# Patient Record
Sex: Male | Born: 1935 | Race: White | Hispanic: No
Health system: Midwestern US, Community
[De-identification: ages and names within clinical notes are randomized; demographics above are authoritative.]

## PROBLEM LIST (undated history)

## (undated) DIAGNOSIS — I4892 Unspecified atrial flutter: Secondary | ICD-10-CM

## (undated) DIAGNOSIS — I480 Paroxysmal atrial fibrillation: Secondary | ICD-10-CM

## (undated) DIAGNOSIS — I10 Essential (primary) hypertension: Secondary | ICD-10-CM

## (undated) HISTORY — DX: Unspecified atrial flutter: I48.92

## (undated) HISTORY — DX: Paroxysmal atrial fibrillation: I48.0

## (undated) HISTORY — PX: TONSILLECTOMY: SUR1361

## (undated) HISTORY — PX: TOTAL ANKLE REPLACEMENT: SUR1218

## (undated) HISTORY — DX: Essential (primary) hypertension: I10

---

## 2002-07-01 ENCOUNTER — Inpatient Hospital Stay (HOSPITAL_COMMUNITY): Admission: RE | Admit: 2002-07-01 | Discharge: 2002-07-03 | Payer: Self-pay | Admitting: Urology

## 2002-07-01 HISTORY — PX: TRANSURETHRAL RESECTION OF PROSTATE: SHX73

## 2011-03-12 ENCOUNTER — Telehealth: Payer: Self-pay | Admitting: Cardiology

## 2011-03-12 NOTE — Telephone Encounter (Signed)
Requesting app w/atypical cp; sent records; 1st available is 8/6. She accepted.

## 2011-03-12 NOTE — Telephone Encounter (Signed)
[facility] called wanted Nicholas Wood to see this pt for atypical chest pain but Dr [place] doesn't have consult appt until September please advise her if sooner appt can be made

## 2011-03-21 ENCOUNTER — Encounter: Payer: Self-pay | Admitting: *Deleted

## 2011-03-25 ENCOUNTER — Encounter: Payer: Self-pay | Admitting: Cardiology

## 2011-03-25 ENCOUNTER — Ambulatory Visit (INDEPENDENT_AMBULATORY_CARE_PROVIDER_SITE_OTHER): Payer: Medicare Other | Admitting: Cardiology

## 2011-03-25 DIAGNOSIS — R079 Chest pain, unspecified: Secondary | ICD-10-CM | POA: Insufficient documentation

## 2011-03-25 DIAGNOSIS — I10 Essential (primary) hypertension: Secondary | ICD-10-CM

## 2011-03-25 NOTE — Assessment & Plan Note (Signed)
His blood pressure is elevated today. He is currently on Benicar and HCTZ. Prior to that he was on amlodipine 10 mg daily. I have suggested that he resume amlodipine 2.5 mg daily in addition to his current medications. He will monitor his blood pressure more closely at home.

## 2011-03-25 NOTE — Patient Instructions (Signed)
I would consider taking 2.5 mg Norvasc in addition to your other medications for blood pressure.  I will be happy to see you again if you have recurrent chest pain.

## 2011-03-25 NOTE — Assessment & Plan Note (Signed)
His prior chest pain is atypical and most consistent with a musculoskeletal injury. It has resolved. His only risk factor is hypertension. I've recommended continue risk factor modification. I don't feel that he needs stress testing at this point unless he were to have recurrent pain.

## 2011-03-25 NOTE — Progress Notes (Signed)
   Nicholas Wood, Dr. 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 Renne Crigler for evaluation of chest pain. He is a pleasant 75 year old white male. One month ago he was cycling with his brother riding mountain bikes when he felt a pull in his rib cage. After this he developed significant pain in his ribs to the point where he had difficulty sleeping. His blood pressure was elevated that time. He reports that it felt like a muscle strain and in fact his pain did resolve. The pain was exacerbated by sneezing. Since then he has had no recurrent chest pain. His only cardiac risk factor is a history of hypertension. He has had no prior cardiac evaluation.  Current Outpatient Prescriptions on File Prior to Visit  Medication Sig Dispense Refill  . Cyanocobalamin (VITAMIN B-12 IJ) Inject 1,000 mLs as directed every 30 (thirty) days.        Marland Kitchen FOLIC ACID PO Take 1 tablet by mouth daily.        . hydrochlorothiazide 25 MG tablet Take 12.5 mg by mouth daily.       Marland Kitchen olmesartan (BENICAR) 40 MG tablet Take 40 mg by mouth daily.        . Omega-3 Fatty Acids (FISH OIL PO) Take 1 capsule by mouth daily.       . Tadalafil (CIALIS PO) Take 1 tablet by mouth as needed.        . Zolpidem Tartrate (AMBIEN PO) Take 1 tablet by mouth as needed.          No Known Allergies  Past Medical History  Diagnosis Date  . Chest pain   . Hypertension     Past Surgical History  Procedure Date  . Transurethral resection of prostate 07/01/2002    Benign prostatic hypertrophy with bladder outlet obstruction    History  Smoking status  . Never Smoker   Smokeless tobacco  . Never Used    History  Alcohol Use No    Family History  Problem Relation Age of Onset  . Heart failure Father   . Alzheimer's disease Mother   . Diabetes Daughter     Obesity    Review of Systems: The review of systems is positive for hypertension. He feels that he is in excellent health.  He eats a DASH diet. All other systems were reviewed and are negative.  Physical Exam: BP 150/92  Pulse 60  Ht 5\' 10"  (1.778 m)  Wt 184 lb (83.462 kg)  BMI 26.40 kg/m2 He is a pleasant white male in no acute distress. He is normocephalic, atraumatic. Pupils are equal round and reactive to light accommodation. Extraocular movements are full. Sclera clear. Oropharynx is clear. Neck is supple no JVD, adenopathy, thyromegaly, or bruits. Lungs are clear to station and percussion. Cardiac exam reveals a regular rate and rhythm without gallop, murmur, or click. Abdomen is soft and nontender without masses or bruits. Femoral and pedal pulses are 2+ and symmetric. He has no edema. He is alert and oriented x3. Cranial nerves II through XII are intact. LABORATORY DATA: Blood work dated February 27, 2011 showed a normal CBC and chemistry panel. CPK was 79. His last lipid panel showed a non-HDL of 117 with an LDL of 161, HDL 43, and triglycerides of 36. ECG dated February 25, 2011 was normal.  Assessment / Plan:

## 2011-09-30 DIAGNOSIS — I10 Essential (primary) hypertension: Secondary | ICD-10-CM | POA: Diagnosis not present

## 2011-10-28 ENCOUNTER — Other Ambulatory Visit: Payer: Self-pay | Admitting: Dermatology

## 2011-10-28 DIAGNOSIS — L578 Other skin changes due to chronic exposure to nonionizing radiation: Secondary | ICD-10-CM | POA: Diagnosis not present

## 2011-10-28 DIAGNOSIS — C44211 Basal cell carcinoma of skin of unspecified ear and external auricular canal: Secondary | ICD-10-CM | POA: Diagnosis not present

## 2011-12-04 DIAGNOSIS — I10 Essential (primary) hypertension: Secondary | ICD-10-CM | POA: Diagnosis not present

## 2012-01-03 DIAGNOSIS — M79609 Pain in unspecified limb: Secondary | ICD-10-CM | POA: Diagnosis not present

## 2012-01-03 DIAGNOSIS — I10 Essential (primary) hypertension: Secondary | ICD-10-CM | POA: Diagnosis not present

## 2012-01-24 DIAGNOSIS — M19079 Primary osteoarthritis, unspecified ankle and foot: Secondary | ICD-10-CM | POA: Diagnosis not present

## 2012-04-13 DIAGNOSIS — N402 Nodular prostate without lower urinary tract symptoms: Secondary | ICD-10-CM | POA: Diagnosis not present

## 2012-04-13 DIAGNOSIS — N401 Enlarged prostate with lower urinary tract symptoms: Secondary | ICD-10-CM | POA: Diagnosis not present

## 2012-04-13 DIAGNOSIS — N138 Other obstructive and reflux uropathy: Secondary | ICD-10-CM | POA: Diagnosis not present

## 2012-04-13 DIAGNOSIS — N529 Male erectile dysfunction, unspecified: Secondary | ICD-10-CM | POA: Diagnosis not present

## 2012-06-04 DIAGNOSIS — Z23 Encounter for immunization: Secondary | ICD-10-CM | POA: Diagnosis not present

## 2012-06-05 DIAGNOSIS — I10 Essential (primary) hypertension: Secondary | ICD-10-CM | POA: Diagnosis not present

## 2012-06-15 DIAGNOSIS — R0989 Other specified symptoms and signs involving the circulatory and respiratory systems: Secondary | ICD-10-CM | POA: Diagnosis not present

## 2012-06-15 DIAGNOSIS — R5381 Other malaise: Secondary | ICD-10-CM | POA: Diagnosis not present

## 2012-06-15 DIAGNOSIS — Z0000 Encounter for general adult medical examination without abnormal findings: Secondary | ICD-10-CM | POA: Diagnosis not present

## 2012-06-15 DIAGNOSIS — R0609 Other forms of dyspnea: Secondary | ICD-10-CM | POA: Diagnosis not present

## 2012-06-15 DIAGNOSIS — H6120 Impacted cerumen, unspecified ear: Secondary | ICD-10-CM | POA: Diagnosis not present

## 2012-06-15 DIAGNOSIS — I10 Essential (primary) hypertension: Secondary | ICD-10-CM | POA: Diagnosis not present

## 2012-06-15 DIAGNOSIS — R5383 Other fatigue: Secondary | ICD-10-CM | POA: Diagnosis not present

## 2012-06-15 DIAGNOSIS — H9190 Unspecified hearing loss, unspecified ear: Secondary | ICD-10-CM | POA: Diagnosis not present

## 2012-06-30 DIAGNOSIS — D72819 Decreased white blood cell count, unspecified: Secondary | ICD-10-CM | POA: Diagnosis not present

## 2012-06-30 DIAGNOSIS — E876 Hypokalemia: Secondary | ICD-10-CM | POA: Diagnosis not present

## 2012-08-04 DIAGNOSIS — I10 Essential (primary) hypertension: Secondary | ICD-10-CM | POA: Diagnosis not present

## 2012-09-21 DIAGNOSIS — I10 Essential (primary) hypertension: Secondary | ICD-10-CM | POA: Diagnosis not present

## 2012-10-05 DIAGNOSIS — I10 Essential (primary) hypertension: Secondary | ICD-10-CM | POA: Diagnosis not present

## 2012-10-19 DIAGNOSIS — I10 Essential (primary) hypertension: Secondary | ICD-10-CM | POA: Diagnosis not present

## 2012-11-18 DIAGNOSIS — I10 Essential (primary) hypertension: Secondary | ICD-10-CM | POA: Diagnosis not present

## 2012-11-18 DIAGNOSIS — N529 Male erectile dysfunction, unspecified: Secondary | ICD-10-CM | POA: Diagnosis not present

## 2012-11-18 DIAGNOSIS — H6120 Impacted cerumen, unspecified ear: Secondary | ICD-10-CM | POA: Diagnosis not present

## 2012-12-22 DIAGNOSIS — I10 Essential (primary) hypertension: Secondary | ICD-10-CM | POA: Diagnosis not present

## 2012-12-22 DIAGNOSIS — F329 Major depressive disorder, single episode, unspecified: Secondary | ICD-10-CM | POA: Diagnosis not present

## 2013-01-25 DIAGNOSIS — I10 Essential (primary) hypertension: Secondary | ICD-10-CM | POA: Diagnosis not present

## 2013-02-12 DIAGNOSIS — M2040 Other hammer toe(s) (acquired), unspecified foot: Secondary | ICD-10-CM | POA: Diagnosis not present

## 2013-02-12 DIAGNOSIS — M7750 Other enthesopathy of unspecified foot: Secondary | ICD-10-CM | POA: Diagnosis not present

## 2013-02-12 DIAGNOSIS — Q6689 Other  specified congenital deformities of feet: Secondary | ICD-10-CM | POA: Diagnosis not present

## 2013-03-24 DIAGNOSIS — I10 Essential (primary) hypertension: Secondary | ICD-10-CM | POA: Diagnosis not present

## 2013-03-24 DIAGNOSIS — F329 Major depressive disorder, single episode, unspecified: Secondary | ICD-10-CM | POA: Diagnosis not present

## 2013-03-24 DIAGNOSIS — Z006 Encounter for examination for normal comparison and control in clinical research program: Secondary | ICD-10-CM | POA: Diagnosis not present

## 2013-03-24 DIAGNOSIS — N529 Male erectile dysfunction, unspecified: Secondary | ICD-10-CM | POA: Diagnosis not present

## 2013-06-21 DIAGNOSIS — I10 Essential (primary) hypertension: Secondary | ICD-10-CM | POA: Diagnosis not present

## 2013-06-21 DIAGNOSIS — Z0000 Encounter for general adult medical examination without abnormal findings: Secondary | ICD-10-CM | POA: Diagnosis not present

## 2013-06-23 DIAGNOSIS — N401 Enlarged prostate with lower urinary tract symptoms: Secondary | ICD-10-CM | POA: Diagnosis not present

## 2013-06-23 DIAGNOSIS — N402 Nodular prostate without lower urinary tract symptoms: Secondary | ICD-10-CM | POA: Diagnosis not present

## 2013-06-23 DIAGNOSIS — N139 Obstructive and reflux uropathy, unspecified: Secondary | ICD-10-CM | POA: Diagnosis not present

## 2013-06-23 DIAGNOSIS — N138 Other obstructive and reflux uropathy: Secondary | ICD-10-CM | POA: Diagnosis not present

## 2013-06-30 DIAGNOSIS — R972 Elevated prostate specific antigen [PSA]: Secondary | ICD-10-CM | POA: Diagnosis not present

## 2013-06-30 DIAGNOSIS — N529 Male erectile dysfunction, unspecified: Secondary | ICD-10-CM | POA: Diagnosis not present

## 2013-06-30 DIAGNOSIS — N402 Nodular prostate without lower urinary tract symptoms: Secondary | ICD-10-CM | POA: Diagnosis not present

## 2013-07-05 DIAGNOSIS — Z0000 Encounter for general adult medical examination without abnormal findings: Secondary | ICD-10-CM | POA: Diagnosis not present

## 2013-07-12 DIAGNOSIS — E538 Deficiency of other specified B group vitamins: Secondary | ICD-10-CM | POA: Diagnosis not present

## 2013-07-12 DIAGNOSIS — H9190 Unspecified hearing loss, unspecified ear: Secondary | ICD-10-CM | POA: Diagnosis not present

## 2013-07-12 DIAGNOSIS — I10 Essential (primary) hypertension: Secondary | ICD-10-CM | POA: Diagnosis not present

## 2013-07-12 DIAGNOSIS — N529 Male erectile dysfunction, unspecified: Secondary | ICD-10-CM | POA: Diagnosis not present

## 2013-07-12 DIAGNOSIS — I4949 Other premature depolarization: Secondary | ICD-10-CM | POA: Diagnosis not present

## 2013-07-12 DIAGNOSIS — Z23 Encounter for immunization: Secondary | ICD-10-CM | POA: Diagnosis not present

## 2013-09-02 ENCOUNTER — Other Ambulatory Visit: Payer: Self-pay | Admitting: Internal Medicine

## 2013-09-02 DIAGNOSIS — R52 Pain, unspecified: Secondary | ICD-10-CM

## 2013-09-02 DIAGNOSIS — R6889 Other general symptoms and signs: Secondary | ICD-10-CM | POA: Diagnosis not present

## 2013-09-02 DIAGNOSIS — R7309 Other abnormal glucose: Secondary | ICD-10-CM | POA: Diagnosis not present

## 2013-09-02 DIAGNOSIS — R0989 Other specified symptoms and signs involving the circulatory and respiratory systems: Secondary | ICD-10-CM

## 2013-09-02 DIAGNOSIS — I10 Essential (primary) hypertension: Secondary | ICD-10-CM | POA: Diagnosis not present

## 2013-09-03 ENCOUNTER — Ambulatory Visit
Admission: RE | Admit: 2013-09-03 | Discharge: 2013-09-03 | Disposition: A | Discharge status: Home / Self Care | Payer: Medicare Other | Source: Ambulatory Visit | Attending: Internal Medicine | Admitting: Internal Medicine

## 2013-09-03 DIAGNOSIS — R030 Elevated blood-pressure reading, without diagnosis of hypertension: Secondary | ICD-10-CM | POA: Diagnosis not present

## 2013-09-03 DIAGNOSIS — R0989 Other specified symptoms and signs involving the circulatory and respiratory systems: Secondary | ICD-10-CM

## 2013-09-08 DIAGNOSIS — N402 Nodular prostate without lower urinary tract symptoms: Secondary | ICD-10-CM | POA: Diagnosis not present

## 2013-12-07 DIAGNOSIS — L570 Actinic keratosis: Secondary | ICD-10-CM | POA: Diagnosis not present

## 2013-12-09 DIAGNOSIS — R269 Unspecified abnormalities of gait and mobility: Secondary | ICD-10-CM | POA: Diagnosis not present

## 2013-12-09 DIAGNOSIS — M6281 Muscle weakness (generalized): Secondary | ICD-10-CM | POA: Diagnosis not present

## 2013-12-09 DIAGNOSIS — M25676 Stiffness of unspecified foot, not elsewhere classified: Secondary | ICD-10-CM | POA: Diagnosis not present

## 2013-12-09 DIAGNOSIS — M25673 Stiffness of unspecified ankle, not elsewhere classified: Secondary | ICD-10-CM | POA: Diagnosis not present

## 2013-12-13 DIAGNOSIS — R269 Unspecified abnormalities of gait and mobility: Secondary | ICD-10-CM | POA: Diagnosis not present

## 2013-12-13 DIAGNOSIS — M6281 Muscle weakness (generalized): Secondary | ICD-10-CM | POA: Diagnosis not present

## 2013-12-13 DIAGNOSIS — M25673 Stiffness of unspecified ankle, not elsewhere classified: Secondary | ICD-10-CM | POA: Diagnosis not present

## 2013-12-13 DIAGNOSIS — M25676 Stiffness of unspecified foot, not elsewhere classified: Secondary | ICD-10-CM | POA: Diagnosis not present

## 2013-12-17 DIAGNOSIS — R269 Unspecified abnormalities of gait and mobility: Secondary | ICD-10-CM | POA: Diagnosis not present

## 2013-12-17 DIAGNOSIS — M25676 Stiffness of unspecified foot, not elsewhere classified: Secondary | ICD-10-CM | POA: Diagnosis not present

## 2013-12-17 DIAGNOSIS — M25673 Stiffness of unspecified ankle, not elsewhere classified: Secondary | ICD-10-CM | POA: Diagnosis not present

## 2013-12-17 DIAGNOSIS — M6281 Muscle weakness (generalized): Secondary | ICD-10-CM | POA: Diagnosis not present

## 2013-12-20 DIAGNOSIS — R269 Unspecified abnormalities of gait and mobility: Secondary | ICD-10-CM | POA: Diagnosis not present

## 2013-12-20 DIAGNOSIS — M25676 Stiffness of unspecified foot, not elsewhere classified: Secondary | ICD-10-CM | POA: Diagnosis not present

## 2013-12-20 DIAGNOSIS — M25673 Stiffness of unspecified ankle, not elsewhere classified: Secondary | ICD-10-CM | POA: Diagnosis not present

## 2013-12-20 DIAGNOSIS — M6281 Muscle weakness (generalized): Secondary | ICD-10-CM | POA: Diagnosis not present

## 2013-12-23 DIAGNOSIS — N402 Nodular prostate without lower urinary tract symptoms: Secondary | ICD-10-CM | POA: Diagnosis not present

## 2013-12-24 DIAGNOSIS — M6281 Muscle weakness (generalized): Secondary | ICD-10-CM | POA: Diagnosis not present

## 2013-12-24 DIAGNOSIS — R269 Unspecified abnormalities of gait and mobility: Secondary | ICD-10-CM | POA: Diagnosis not present

## 2013-12-24 DIAGNOSIS — M25676 Stiffness of unspecified foot, not elsewhere classified: Secondary | ICD-10-CM | POA: Diagnosis not present

## 2013-12-24 DIAGNOSIS — M25673 Stiffness of unspecified ankle, not elsewhere classified: Secondary | ICD-10-CM | POA: Diagnosis not present

## 2013-12-28 DIAGNOSIS — M25673 Stiffness of unspecified ankle, not elsewhere classified: Secondary | ICD-10-CM | POA: Diagnosis not present

## 2013-12-28 DIAGNOSIS — R269 Unspecified abnormalities of gait and mobility: Secondary | ICD-10-CM | POA: Diagnosis not present

## 2013-12-28 DIAGNOSIS — M25676 Stiffness of unspecified foot, not elsewhere classified: Secondary | ICD-10-CM | POA: Diagnosis not present

## 2013-12-28 DIAGNOSIS — M6281 Muscle weakness (generalized): Secondary | ICD-10-CM | POA: Diagnosis not present

## 2013-12-30 DIAGNOSIS — R972 Elevated prostate specific antigen [PSA]: Secondary | ICD-10-CM | POA: Diagnosis not present

## 2013-12-30 DIAGNOSIS — N402 Nodular prostate without lower urinary tract symptoms: Secondary | ICD-10-CM | POA: Diagnosis not present

## 2013-12-31 DIAGNOSIS — M25676 Stiffness of unspecified foot, not elsewhere classified: Secondary | ICD-10-CM | POA: Diagnosis not present

## 2013-12-31 DIAGNOSIS — M6281 Muscle weakness (generalized): Secondary | ICD-10-CM | POA: Diagnosis not present

## 2013-12-31 DIAGNOSIS — R269 Unspecified abnormalities of gait and mobility: Secondary | ICD-10-CM | POA: Diagnosis not present

## 2013-12-31 DIAGNOSIS — M25673 Stiffness of unspecified ankle, not elsewhere classified: Secondary | ICD-10-CM | POA: Diagnosis not present

## 2014-01-03 DIAGNOSIS — M6281 Muscle weakness (generalized): Secondary | ICD-10-CM | POA: Diagnosis not present

## 2014-01-03 DIAGNOSIS — R269 Unspecified abnormalities of gait and mobility: Secondary | ICD-10-CM | POA: Diagnosis not present

## 2014-01-03 DIAGNOSIS — M25676 Stiffness of unspecified foot, not elsewhere classified: Secondary | ICD-10-CM | POA: Diagnosis not present

## 2014-01-03 DIAGNOSIS — M25673 Stiffness of unspecified ankle, not elsewhere classified: Secondary | ICD-10-CM | POA: Diagnosis not present

## 2014-01-06 DIAGNOSIS — M25676 Stiffness of unspecified foot, not elsewhere classified: Secondary | ICD-10-CM | POA: Diagnosis not present

## 2014-01-06 DIAGNOSIS — R269 Unspecified abnormalities of gait and mobility: Secondary | ICD-10-CM | POA: Diagnosis not present

## 2014-01-06 DIAGNOSIS — M6281 Muscle weakness (generalized): Secondary | ICD-10-CM | POA: Diagnosis not present

## 2014-01-06 DIAGNOSIS — N402 Nodular prostate without lower urinary tract symptoms: Secondary | ICD-10-CM | POA: Diagnosis not present

## 2014-01-06 DIAGNOSIS — M25673 Stiffness of unspecified ankle, not elsewhere classified: Secondary | ICD-10-CM | POA: Diagnosis not present

## 2014-01-21 DIAGNOSIS — M25676 Stiffness of unspecified foot, not elsewhere classified: Secondary | ICD-10-CM | POA: Diagnosis not present

## 2014-01-21 DIAGNOSIS — M25673 Stiffness of unspecified ankle, not elsewhere classified: Secondary | ICD-10-CM | POA: Diagnosis not present

## 2014-01-21 DIAGNOSIS — M6281 Muscle weakness (generalized): Secondary | ICD-10-CM | POA: Diagnosis not present

## 2014-01-21 DIAGNOSIS — R269 Unspecified abnormalities of gait and mobility: Secondary | ICD-10-CM | POA: Diagnosis not present

## 2014-01-28 DIAGNOSIS — M25676 Stiffness of unspecified foot, not elsewhere classified: Secondary | ICD-10-CM | POA: Diagnosis not present

## 2014-01-28 DIAGNOSIS — R269 Unspecified abnormalities of gait and mobility: Secondary | ICD-10-CM | POA: Diagnosis not present

## 2014-01-28 DIAGNOSIS — M25673 Stiffness of unspecified ankle, not elsewhere classified: Secondary | ICD-10-CM | POA: Diagnosis not present

## 2014-01-28 DIAGNOSIS — M6281 Muscle weakness (generalized): Secondary | ICD-10-CM | POA: Diagnosis not present

## 2014-01-31 DIAGNOSIS — N402 Nodular prostate without lower urinary tract symptoms: Secondary | ICD-10-CM | POA: Diagnosis not present

## 2014-01-31 DIAGNOSIS — R972 Elevated prostate specific antigen [PSA]: Secondary | ICD-10-CM | POA: Diagnosis not present

## 2014-02-04 DIAGNOSIS — M25673 Stiffness of unspecified ankle, not elsewhere classified: Secondary | ICD-10-CM | POA: Diagnosis not present

## 2014-02-04 DIAGNOSIS — M25676 Stiffness of unspecified foot, not elsewhere classified: Secondary | ICD-10-CM | POA: Diagnosis not present

## 2014-02-04 DIAGNOSIS — R269 Unspecified abnormalities of gait and mobility: Secondary | ICD-10-CM | POA: Diagnosis not present

## 2014-02-04 DIAGNOSIS — M6281 Muscle weakness (generalized): Secondary | ICD-10-CM | POA: Diagnosis not present

## 2014-02-09 DIAGNOSIS — M25676 Stiffness of unspecified foot, not elsewhere classified: Secondary | ICD-10-CM | POA: Diagnosis not present

## 2014-02-09 DIAGNOSIS — R269 Unspecified abnormalities of gait and mobility: Secondary | ICD-10-CM | POA: Diagnosis not present

## 2014-02-09 DIAGNOSIS — M6281 Muscle weakness (generalized): Secondary | ICD-10-CM | POA: Diagnosis not present

## 2014-02-09 DIAGNOSIS — M25673 Stiffness of unspecified ankle, not elsewhere classified: Secondary | ICD-10-CM | POA: Diagnosis not present

## 2014-03-04 DIAGNOSIS — M25676 Stiffness of unspecified foot, not elsewhere classified: Secondary | ICD-10-CM | POA: Diagnosis not present

## 2014-03-04 DIAGNOSIS — R269 Unspecified abnormalities of gait and mobility: Secondary | ICD-10-CM | POA: Diagnosis not present

## 2014-03-04 DIAGNOSIS — M6281 Muscle weakness (generalized): Secondary | ICD-10-CM | POA: Diagnosis not present

## 2014-03-04 DIAGNOSIS — M25673 Stiffness of unspecified ankle, not elsewhere classified: Secondary | ICD-10-CM | POA: Diagnosis not present

## 2014-03-07 DIAGNOSIS — L03019 Cellulitis of unspecified finger: Secondary | ICD-10-CM | POA: Diagnosis not present

## 2014-03-07 DIAGNOSIS — L02519 Cutaneous abscess of unspecified hand: Secondary | ICD-10-CM | POA: Diagnosis not present

## 2014-03-08 DIAGNOSIS — L03019 Cellulitis of unspecified finger: Secondary | ICD-10-CM | POA: Diagnosis not present

## 2014-03-08 DIAGNOSIS — L02519 Cutaneous abscess of unspecified hand: Secondary | ICD-10-CM | POA: Diagnosis not present

## 2014-03-10 DIAGNOSIS — L03019 Cellulitis of unspecified finger: Secondary | ICD-10-CM | POA: Diagnosis not present

## 2014-03-10 DIAGNOSIS — L02519 Cutaneous abscess of unspecified hand: Secondary | ICD-10-CM | POA: Diagnosis not present

## 2014-03-28 DIAGNOSIS — M2040 Other hammer toe(s) (acquired), unspecified foot: Secondary | ICD-10-CM | POA: Diagnosis not present

## 2014-06-17 DIAGNOSIS — Z23 Encounter for immunization: Secondary | ICD-10-CM | POA: Diagnosis not present

## 2014-06-28 DIAGNOSIS — L570 Actinic keratosis: Secondary | ICD-10-CM | POA: Diagnosis not present

## 2014-07-05 DIAGNOSIS — G629 Polyneuropathy, unspecified: Secondary | ICD-10-CM | POA: Diagnosis not present

## 2014-07-05 DIAGNOSIS — M25571 Pain in right ankle and joints of right foot: Secondary | ICD-10-CM | POA: Diagnosis not present

## 2014-07-05 DIAGNOSIS — M79671 Pain in right foot: Secondary | ICD-10-CM | POA: Diagnosis not present

## 2014-07-05 DIAGNOSIS — M216X1 Other acquired deformities of right foot: Secondary | ICD-10-CM | POA: Diagnosis not present

## 2014-07-05 DIAGNOSIS — M13871 Other specified arthritis, right ankle and foot: Secondary | ICD-10-CM | POA: Diagnosis not present

## 2014-07-05 DIAGNOSIS — M205X1 Other deformities of toe(s) (acquired), right foot: Secondary | ICD-10-CM | POA: Diagnosis not present

## 2014-07-05 DIAGNOSIS — Q661 Congenital talipes calcaneovarus: Secondary | ICD-10-CM | POA: Diagnosis not present

## 2014-07-05 DIAGNOSIS — M129 Arthropathy, unspecified: Secondary | ICD-10-CM | POA: Diagnosis not present

## 2014-07-05 DIAGNOSIS — M216X9 Other acquired deformities of unspecified foot: Secondary | ICD-10-CM | POA: Diagnosis not present

## 2014-07-05 DIAGNOSIS — M21961 Unspecified acquired deformity of right lower leg: Secondary | ICD-10-CM | POA: Diagnosis not present

## 2014-07-21 DIAGNOSIS — H01003 Unspecified blepharitis right eye, unspecified eyelid: Secondary | ICD-10-CM | POA: Diagnosis not present

## 2014-07-21 DIAGNOSIS — H01006 Unspecified blepharitis left eye, unspecified eyelid: Secondary | ICD-10-CM | POA: Diagnosis not present

## 2014-07-21 DIAGNOSIS — H2513 Age-related nuclear cataract, bilateral: Secondary | ICD-10-CM | POA: Diagnosis not present

## 2014-07-29 DIAGNOSIS — D72819 Decreased white blood cell count, unspecified: Secondary | ICD-10-CM | POA: Diagnosis not present

## 2014-07-29 DIAGNOSIS — I10 Essential (primary) hypertension: Secondary | ICD-10-CM | POA: Diagnosis not present

## 2014-07-29 DIAGNOSIS — Z125 Encounter for screening for malignant neoplasm of prostate: Secondary | ICD-10-CM | POA: Diagnosis not present

## 2014-07-29 DIAGNOSIS — S9304XA Dislocation of right ankle joint, initial encounter: Secondary | ICD-10-CM | POA: Diagnosis not present

## 2014-07-29 DIAGNOSIS — Z0000 Encounter for general adult medical examination without abnormal findings: Secondary | ICD-10-CM | POA: Diagnosis not present

## 2014-08-03 DIAGNOSIS — Z1212 Encounter for screening for malignant neoplasm of rectum: Secondary | ICD-10-CM | POA: Diagnosis not present

## 2014-08-03 DIAGNOSIS — N5201 Erectile dysfunction due to arterial insufficiency: Secondary | ICD-10-CM | POA: Diagnosis not present

## 2014-08-03 DIAGNOSIS — D72819 Decreased white blood cell count, unspecified: Secondary | ICD-10-CM | POA: Diagnosis not present

## 2014-08-03 DIAGNOSIS — M25571 Pain in right ankle and joints of right foot: Secondary | ICD-10-CM | POA: Diagnosis not present

## 2014-08-03 DIAGNOSIS — I10 Essential (primary) hypertension: Secondary | ICD-10-CM | POA: Diagnosis not present

## 2014-08-03 DIAGNOSIS — H6123 Impacted cerumen, bilateral: Secondary | ICD-10-CM | POA: Diagnosis not present

## 2014-08-24 DIAGNOSIS — H2511 Age-related nuclear cataract, right eye: Secondary | ICD-10-CM | POA: Diagnosis not present

## 2014-08-24 DIAGNOSIS — H2589 Other age-related cataract: Secondary | ICD-10-CM | POA: Diagnosis not present

## 2014-08-24 DIAGNOSIS — I10 Essential (primary) hypertension: Secondary | ICD-10-CM | POA: Diagnosis not present

## 2014-08-24 DIAGNOSIS — Z79899 Other long term (current) drug therapy: Secondary | ICD-10-CM | POA: Diagnosis not present

## 2014-09-07 DIAGNOSIS — M1990 Unspecified osteoarthritis, unspecified site: Secondary | ICD-10-CM | POA: Diagnosis not present

## 2014-09-07 DIAGNOSIS — Q661 Congenital talipes calcaneovarus: Secondary | ICD-10-CM | POA: Diagnosis not present

## 2014-09-16 DIAGNOSIS — I10 Essential (primary) hypertension: Secondary | ICD-10-CM | POA: Diagnosis not present

## 2014-09-19 DIAGNOSIS — S86311A Strain of muscle(s) and tendon(s) of peroneal muscle group at lower leg level, right leg, initial encounter: Secondary | ICD-10-CM | POA: Diagnosis not present

## 2014-09-19 DIAGNOSIS — M1990 Unspecified osteoarthritis, unspecified site: Secondary | ICD-10-CM | POA: Diagnosis not present

## 2014-09-19 DIAGNOSIS — M25571 Pain in right ankle and joints of right foot: Secondary | ICD-10-CM | POA: Diagnosis not present

## 2014-09-19 DIAGNOSIS — M25371 Other instability, right ankle: Secondary | ICD-10-CM | POA: Diagnosis not present

## 2014-09-19 DIAGNOSIS — M216X1 Other acquired deformities of right foot: Secondary | ICD-10-CM | POA: Diagnosis not present

## 2014-10-18 DIAGNOSIS — I10 Essential (primary) hypertension: Secondary | ICD-10-CM | POA: Diagnosis not present

## 2014-10-19 DIAGNOSIS — I10 Essential (primary) hypertension: Secondary | ICD-10-CM | POA: Diagnosis not present

## 2014-10-19 DIAGNOSIS — H2512 Age-related nuclear cataract, left eye: Secondary | ICD-10-CM | POA: Diagnosis not present

## 2014-10-19 DIAGNOSIS — H2510 Age-related nuclear cataract, unspecified eye: Secondary | ICD-10-CM | POA: Diagnosis not present

## 2014-11-07 DIAGNOSIS — H35033 Hypertensive retinopathy, bilateral: Secondary | ICD-10-CM | POA: Diagnosis not present

## 2014-11-07 DIAGNOSIS — Z961 Presence of intraocular lens: Secondary | ICD-10-CM | POA: Diagnosis not present

## 2014-11-17 DIAGNOSIS — M25371 Other instability, right ankle: Secondary | ICD-10-CM | POA: Diagnosis not present

## 2014-11-17 DIAGNOSIS — I10 Essential (primary) hypertension: Secondary | ICD-10-CM | POA: Diagnosis not present

## 2014-11-17 DIAGNOSIS — M216X1 Other acquired deformities of right foot: Secondary | ICD-10-CM | POA: Diagnosis not present

## 2014-11-17 DIAGNOSIS — Z01810 Encounter for preprocedural cardiovascular examination: Secondary | ICD-10-CM | POA: Diagnosis not present

## 2014-11-22 DIAGNOSIS — Z961 Presence of intraocular lens: Secondary | ICD-10-CM | POA: Diagnosis not present

## 2014-11-25 DIAGNOSIS — M19071 Primary osteoarthritis, right ankle and foot: Secondary | ICD-10-CM | POA: Diagnosis not present

## 2014-11-25 DIAGNOSIS — G8918 Other acute postprocedural pain: Secondary | ICD-10-CM | POA: Diagnosis not present

## 2014-11-25 DIAGNOSIS — M24374 Pathological dislocation of right foot, not elsewhere classified: Secondary | ICD-10-CM | POA: Diagnosis not present

## 2014-11-25 DIAGNOSIS — G8929 Other chronic pain: Secondary | ICD-10-CM | POA: Diagnosis present

## 2014-11-25 DIAGNOSIS — M216X1 Other acquired deformities of right foot: Secondary | ICD-10-CM | POA: Diagnosis not present

## 2014-11-25 DIAGNOSIS — M66371 Spontaneous rupture of flexor tendons, right ankle and foot: Secondary | ICD-10-CM | POA: Diagnosis not present

## 2014-11-29 DIAGNOSIS — R339 Retention of urine, unspecified: Secondary | ICD-10-CM | POA: Diagnosis not present

## 2014-12-01 DIAGNOSIS — R339 Retention of urine, unspecified: Secondary | ICD-10-CM | POA: Diagnosis not present

## 2014-12-15 DIAGNOSIS — I10 Essential (primary) hypertension: Secondary | ICD-10-CM | POA: Diagnosis not present

## 2014-12-15 DIAGNOSIS — E876 Hypokalemia: Secondary | ICD-10-CM | POA: Diagnosis not present

## 2014-12-19 DIAGNOSIS — S86311A Strain of muscle(s) and tendon(s) of peroneal muscle group at lower leg level, right leg, initial encounter: Secondary | ICD-10-CM | POA: Diagnosis not present

## 2014-12-21 DIAGNOSIS — F410 Panic disorder [episodic paroxysmal anxiety] without agoraphobia: Secondary | ICD-10-CM | POA: Diagnosis not present

## 2014-12-21 DIAGNOSIS — G4700 Insomnia, unspecified: Secondary | ICD-10-CM | POA: Diagnosis not present

## 2014-12-26 DIAGNOSIS — N138 Other obstructive and reflux uropathy: Secondary | ICD-10-CM | POA: Diagnosis not present

## 2014-12-26 DIAGNOSIS — N401 Enlarged prostate with lower urinary tract symptoms: Secondary | ICD-10-CM | POA: Diagnosis not present

## 2014-12-26 DIAGNOSIS — R339 Retention of urine, unspecified: Secondary | ICD-10-CM | POA: Diagnosis not present

## 2014-12-29 DIAGNOSIS — F410 Panic disorder [episodic paroxysmal anxiety] without agoraphobia: Secondary | ICD-10-CM | POA: Diagnosis not present

## 2014-12-29 DIAGNOSIS — F329 Major depressive disorder, single episode, unspecified: Secondary | ICD-10-CM | POA: Diagnosis not present

## 2014-12-29 DIAGNOSIS — I10 Essential (primary) hypertension: Secondary | ICD-10-CM | POA: Diagnosis not present

## 2014-12-30 DIAGNOSIS — F410 Panic disorder [episodic paroxysmal anxiety] without agoraphobia: Secondary | ICD-10-CM | POA: Diagnosis not present

## 2015-01-03 DIAGNOSIS — F410 Panic disorder [episodic paroxysmal anxiety] without agoraphobia: Secondary | ICD-10-CM | POA: Diagnosis not present

## 2015-01-03 DIAGNOSIS — F329 Major depressive disorder, single episode, unspecified: Secondary | ICD-10-CM | POA: Diagnosis not present

## 2015-01-03 DIAGNOSIS — F5104 Psychophysiologic insomnia: Secondary | ICD-10-CM | POA: Diagnosis not present

## 2015-01-18 DIAGNOSIS — Z96661 Presence of right artificial ankle joint: Secondary | ICD-10-CM | POA: Diagnosis not present

## 2015-01-18 DIAGNOSIS — M25571 Pain in right ankle and joints of right foot: Secondary | ICD-10-CM | POA: Diagnosis not present

## 2015-01-19 DIAGNOSIS — F419 Anxiety disorder, unspecified: Secondary | ICD-10-CM | POA: Diagnosis not present

## 2015-01-24 DIAGNOSIS — F410 Panic disorder [episodic paroxysmal anxiety] without agoraphobia: Secondary | ICD-10-CM | POA: Diagnosis not present

## 2015-02-14 DIAGNOSIS — F329 Major depressive disorder, single episode, unspecified: Secondary | ICD-10-CM | POA: Diagnosis not present

## 2015-02-14 DIAGNOSIS — I10 Essential (primary) hypertension: Secondary | ICD-10-CM | POA: Diagnosis not present

## 2015-02-14 DIAGNOSIS — N401 Enlarged prostate with lower urinary tract symptoms: Secondary | ICD-10-CM | POA: Diagnosis not present

## 2015-02-14 DIAGNOSIS — F419 Anxiety disorder, unspecified: Secondary | ICD-10-CM | POA: Diagnosis not present

## 2015-02-15 DIAGNOSIS — R972 Elevated prostate specific antigen [PSA]: Secondary | ICD-10-CM | POA: Diagnosis not present

## 2015-02-15 DIAGNOSIS — Z96661 Presence of right artificial ankle joint: Secondary | ICD-10-CM | POA: Diagnosis not present

## 2015-02-15 DIAGNOSIS — Z471 Aftercare following joint replacement surgery: Secondary | ICD-10-CM | POA: Diagnosis not present

## 2015-02-21 DIAGNOSIS — Z96661 Presence of right artificial ankle joint: Secondary | ICD-10-CM | POA: Diagnosis not present

## 2015-02-21 DIAGNOSIS — R262 Difficulty in walking, not elsewhere classified: Secondary | ICD-10-CM | POA: Diagnosis not present

## 2015-02-22 DIAGNOSIS — N5201 Erectile dysfunction due to arterial insufficiency: Secondary | ICD-10-CM | POA: Diagnosis not present

## 2015-02-22 DIAGNOSIS — N402 Nodular prostate without lower urinary tract symptoms: Secondary | ICD-10-CM | POA: Diagnosis not present

## 2015-02-22 DIAGNOSIS — R972 Elevated prostate specific antigen [PSA]: Secondary | ICD-10-CM | POA: Diagnosis not present

## 2015-02-24 DIAGNOSIS — Z96661 Presence of right artificial ankle joint: Secondary | ICD-10-CM | POA: Diagnosis not present

## 2015-02-24 DIAGNOSIS — R262 Difficulty in walking, not elsewhere classified: Secondary | ICD-10-CM | POA: Diagnosis not present

## 2015-02-27 DIAGNOSIS — Z96661 Presence of right artificial ankle joint: Secondary | ICD-10-CM | POA: Diagnosis not present

## 2015-02-27 DIAGNOSIS — R262 Difficulty in walking, not elsewhere classified: Secondary | ICD-10-CM | POA: Diagnosis not present

## 2015-02-28 DIAGNOSIS — R002 Palpitations: Secondary | ICD-10-CM | POA: Diagnosis not present

## 2015-03-02 ENCOUNTER — Telehealth: Payer: Self-pay | Admitting: Cardiology

## 2015-03-02 DIAGNOSIS — R262 Difficulty in walking, not elsewhere classified: Secondary | ICD-10-CM | POA: Diagnosis not present

## 2015-03-02 DIAGNOSIS — Z96661 Presence of right artificial ankle joint: Secondary | ICD-10-CM | POA: Diagnosis not present

## 2015-03-02 NOTE — Telephone Encounter (Signed)
Received records from [facility] for appointment on 04/12/15 with [name].  Records given to D Chavis (medical records) for Dr Jacalyn Lefevre schedule on 04/12/15. lp

## 2015-03-06 ENCOUNTER — Encounter (INDEPENDENT_AMBULATORY_CARE_PROVIDER_SITE_OTHER): Payer: Medicare Other

## 2015-03-06 DIAGNOSIS — R002 Palpitations: Secondary | ICD-10-CM | POA: Diagnosis not present

## 2015-03-06 DIAGNOSIS — R262 Difficulty in walking, not elsewhere classified: Secondary | ICD-10-CM | POA: Diagnosis not present

## 2015-03-06 DIAGNOSIS — Z96661 Presence of right artificial ankle joint: Secondary | ICD-10-CM | POA: Diagnosis not present

## 2015-03-06 NOTE — Progress Notes (Unsigned)
Applied 24 hour Holter monitor Instruction given.  Return monitor in yellow bag tomorrow afternoon or first thing wednesday.  Patient verbalized understanding.

## 2015-03-08 DIAGNOSIS — F4322 Adjustment disorder with anxiety: Secondary | ICD-10-CM | POA: Diagnosis not present

## 2015-03-10 DIAGNOSIS — Z96661 Presence of right artificial ankle joint: Secondary | ICD-10-CM | POA: Diagnosis not present

## 2015-03-10 DIAGNOSIS — R262 Difficulty in walking, not elsewhere classified: Secondary | ICD-10-CM | POA: Diagnosis not present

## 2015-03-13 DIAGNOSIS — Z96661 Presence of right artificial ankle joint: Secondary | ICD-10-CM | POA: Diagnosis not present

## 2015-03-13 DIAGNOSIS — R262 Difficulty in walking, not elsewhere classified: Secondary | ICD-10-CM | POA: Diagnosis not present

## 2015-03-17 DIAGNOSIS — Z96661 Presence of right artificial ankle joint: Secondary | ICD-10-CM | POA: Diagnosis not present

## 2015-03-17 DIAGNOSIS — R262 Difficulty in walking, not elsewhere classified: Secondary | ICD-10-CM | POA: Diagnosis not present

## 2015-03-27 DIAGNOSIS — Z96661 Presence of right artificial ankle joint: Secondary | ICD-10-CM | POA: Diagnosis not present

## 2015-03-27 DIAGNOSIS — R262 Difficulty in walking, not elsewhere classified: Secondary | ICD-10-CM | POA: Diagnosis not present

## 2015-03-29 DIAGNOSIS — Z96661 Presence of right artificial ankle joint: Secondary | ICD-10-CM | POA: Diagnosis not present

## 2015-03-29 DIAGNOSIS — R262 Difficulty in walking, not elsewhere classified: Secondary | ICD-10-CM | POA: Diagnosis not present

## 2015-04-04 DIAGNOSIS — R002 Palpitations: Secondary | ICD-10-CM | POA: Diagnosis not present

## 2015-04-04 DIAGNOSIS — I10 Essential (primary) hypertension: Secondary | ICD-10-CM | POA: Diagnosis not present

## 2015-04-05 ENCOUNTER — Telehealth: Payer: Self-pay | Admitting: Cardiology

## 2015-04-05 NOTE — Telephone Encounter (Signed)
Received records from [facility] for appointment on 04/12/15 with [name].  Records given to [facility] (medical records) for Dr Jacalyn Lefevre schedule on 04/12/15. lp

## 2015-04-07 NOTE — Progress Notes (Signed)
     HPI: 79 year old male for evaluation of palpitations. ABIs January 2015 normal. Holter monitor July 2016 showed sinus rhythm, PACs, brief PAT, PVCs and occasional couplets. Patient denies dyspnea on exertion, orthopnea, PND, pedal edema, chest pain or syncope. Over the past month he has had intermittent palpitations. They are described as his heart racing. They last approximately 2 hours and resolve spontaneously. There is some associated chest discomfort. No dyspnea or syncope. Because of the above we were asked to evaluate. Note he had ankle replacement in April 2016.  Current Outpatient Prescriptions  Medication Sig Dispense Refill  . Azilsartan Medoxomil (EDARBI) 40 MG TABS Take 1 tablet by mouth daily.    . cyanocobalamin (,VITAMIN B-12,) 1000 MCG/ML injection Inject 1,000 mcg into the muscle every 30 (thirty) days.    Marland Kitchen FOLIC ACID PO Take 1 tablet by mouth daily.      . Zolpidem Tartrate (AMBIEN PO) Take 1 tablet by mouth as needed.       No current facility-administered medications for this visit.    No Known Allergies   Past Medical History  Diagnosis Date  . Chest pain   . Hypertension     Past Surgical History  Procedure Laterality Date  . Transurethral resection of prostate  07/01/2002    Benign prostatic hypertrophy with bladder outlet obstruction  . Total ankle replacement    . Tonsillectomy      Social History   Social History  . Marital Status: Married    Spouse Name: N/A  . Number of Children: 3  . Years of Education: N/A   Occupational History  . pharmacy consultant    Social History Main Topics  . Smoking status: Never Smoker   . Smokeless tobacco: Never Used  . Alcohol Use: 0.0 oz/week    0 Standard drinks or equivalent per week     Comment: Occasional  . Drug Use: No  . Sexual Activity: Not on file   Other Topics Concern  . Not on file   Social History Narrative    Family History  Problem Relation Age of Onset  . Heart failure  Father   . Alzheimer's disease Mother   . Diabetes Daughter     Obesity    ROS: no fevers or chills, productive cough, hemoptysis, dysphasia, odynophagia, melena, hematochezia, dysuria, hematuria, rash, seizure activity, orthopnea, PND, pedal edema, claudication. Remaining systems are negative.  Physical Exam:   Blood pressure 140/90, pulse 88, height 5\' 10"  (1.778 m), weight 184 lb 14.4 oz (83.87 kg).  General:  Well developed/well nourished in NAD Skin warm/dry Patient not depressed No peripheral clubbing Back-normal HEENT-normal/normal eyelids Neck supple/normal carotid upstroke bilaterally; no bruits; no JVD; no thyromegaly chest - CTA/ normal expansion CV - RRR/normal S1 and S2; no murmurs, rubs or gallops;  PMI nondisplaced Abdomen -NT/ND, no HSM, no mass, + bowel sounds, no bruit 2+ femoral pulses, no bruits Ext-no edema, chords, 2+ DP Neuro-grossly nonfocal  ECG 02/28/2015-sinus rhythm with occasional PACs. No ST changes.

## 2015-04-11 DIAGNOSIS — Z96661 Presence of right artificial ankle joint: Secondary | ICD-10-CM | POA: Diagnosis not present

## 2015-04-11 DIAGNOSIS — R262 Difficulty in walking, not elsewhere classified: Secondary | ICD-10-CM | POA: Diagnosis not present

## 2015-04-12 ENCOUNTER — Telehealth (HOSPITAL_COMMUNITY): Payer: Self-pay | Admitting: *Deleted

## 2015-04-12 ENCOUNTER — Ambulatory Visit (INDEPENDENT_AMBULATORY_CARE_PROVIDER_SITE_OTHER): Payer: Medicare Other | Admitting: Cardiology

## 2015-04-12 ENCOUNTER — Encounter: Payer: Self-pay | Admitting: *Deleted

## 2015-04-12 ENCOUNTER — Encounter: Payer: Self-pay | Admitting: Cardiology

## 2015-04-12 VITALS — BP 140/90 | HR 88 | Ht 70.0 in | Wt 184.9 lb

## 2015-04-12 DIAGNOSIS — R002 Palpitations: Secondary | ICD-10-CM | POA: Diagnosis not present

## 2015-04-12 DIAGNOSIS — R079 Chest pain, unspecified: Secondary | ICD-10-CM

## 2015-04-12 NOTE — Patient Instructions (Addendum)
Your physician recommends that you schedule a follow-up appointment in: [place] has recommended that you wear an event monitor. Event monitors are medical devices that record the heart's electrical activity. Doctors most often [place] these monitors to diagnose arrhythmias. Arrhythmias are problems with the speed or rhythm of the heartbeat. The monitor is a small, portable device. You can wear one while you do your normal daily activities. This is usually used to diagnose what is causing palpitations/syncope (passing out).   Your physician has requested that you have an echocardiogram. Echocardiography is a painless test that uses sound waves to create images of your heart. It provides your doctor with information about the size and shape of your heart and how well your heart's chambers and valves are working. This procedure takes approximately one hour. There are no restrictions for this procedure.   Your physician has requested that you have an exercise tolerance test. For further information please visit [email or web address]. Please also follow instruction sheet, as given.    Exercise Stress Electrocardiogram An exercise stress electrocardiogram is a test to check how blood flows to your heart. It is done to find areas of poor blood flow. You will need to walk on a treadmill for this test. The electrocardiogram will record your heartbeat when you are at rest and when you are exercising. BEFORE THE PROCEDURE  Do not have drinks with caffeine or foods with caffeine for 24 hours before the test, or as told by your doctor. This includes coffee, tea (even decaf tea), sodas, chocolate, and cocoa.  Follow your doctor's instructions about eating and drinking before the test.  Ask your doctor what medicines you should or should not take before the test. Take your medicines with water unless told by your doctor not to.  If you use an inhaler, bring it with you to the  test.  Bring a snack to eat after the test.  Do not  smoke for 4 hours before the test.  Do not put lotions, powders, creams, or oils on your chest before the test.  Wear comfortable shoes and clothing. PROCEDURE  You will have patches put on your chest. Small areas of your chest may need to be shaved. Wires will be connected to the patches.  Your heart rate will be watched while you are resting and while you are exercising.  You will walk on the treadmill. The treadmill will slowly get faster to raise your heart rate.  The test will take about 1-2 hours. AFTER THE PROCEDURE  Your heart rate and blood pressure will be watched after the test.  You may return to your normal diet, activities, and medicines or as told by your doctor. Document Released: 01/22/2008 Document Revised: 12/20/2013 Document Reviewed: 04/12/2013 [facility] Patient Information 2015 [place], [place]. This information is not intended to replace advice given to you by your health care provider. Make sure you discuss any questions you have with your health care provider.

## 2015-04-12 NOTE — Assessment & Plan Note (Signed)
Continue present medications. 

## 2015-04-12 NOTE — Assessment & Plan Note (Signed)
Etiology unclear. We will arrange an event monitor to more fully evaluate. Schedule echocardiogram to assess LV function. We will obtain records from primary care concerning most recent TSH.

## 2015-04-12 NOTE — Assessment & Plan Note (Signed)
Schedule ETT for risk stratification.

## 2015-04-13 DIAGNOSIS — Z96661 Presence of right artificial ankle joint: Secondary | ICD-10-CM | POA: Diagnosis not present

## 2015-04-13 DIAGNOSIS — R262 Difficulty in walking, not elsewhere classified: Secondary | ICD-10-CM | POA: Diagnosis not present

## 2015-04-14 ENCOUNTER — Telehealth: Payer: Self-pay | Admitting: Cardiology

## 2015-04-14 NOTE — Telephone Encounter (Signed)
Want to discuss why he need the  Event monitor/saf

## 2015-04-14 NOTE — Telephone Encounter (Signed)
Left message for pt to call.

## 2015-04-17 ENCOUNTER — Telehealth: Payer: Self-pay | Admitting: Cardiology

## 2015-04-17 NOTE — Telephone Encounter (Signed)
Closed encounter °

## 2015-04-19 NOTE — Telephone Encounter (Signed)
Spoke with pt, questions regarding monitor answered. Pt is ready to get a monitor, will forward to sharon to call pt to schedule

## 2015-04-25 DIAGNOSIS — Z96661 Presence of right artificial ankle joint: Secondary | ICD-10-CM | POA: Diagnosis not present

## 2015-04-25 DIAGNOSIS — R262 Difficulty in walking, not elsewhere classified: Secondary | ICD-10-CM | POA: Diagnosis not present

## 2015-04-26 ENCOUNTER — Other Ambulatory Visit: Payer: Self-pay | Admitting: Physician Assistant

## 2015-04-26 ENCOUNTER — Encounter: Payer: Self-pay | Admitting: Cardiology

## 2015-04-26 DIAGNOSIS — R000 Tachycardia, unspecified: Secondary | ICD-10-CM

## 2015-04-26 NOTE — Progress Notes (Signed)
The patient called to report a rapid and irregular HR for the last 4 hours along with high urinary OP.

## 2015-04-26 NOTE — Telephone Encounter (Signed)
Schedule for 05-01-15 @ 11:30/saf

## 2015-04-27 ENCOUNTER — Telehealth: Payer: Self-pay | Admitting: Cardiology

## 2015-04-27 ENCOUNTER — Encounter (INDEPENDENT_AMBULATORY_CARE_PROVIDER_SITE_OTHER): Payer: Medicare Other | Admitting: *Deleted

## 2015-04-27 ENCOUNTER — Ambulatory Visit (INDEPENDENT_AMBULATORY_CARE_PROVIDER_SITE_OTHER): Payer: Medicare Other

## 2015-04-27 VITALS — BP 144/82 | HR 91 | Wt 180.0 lb

## 2015-04-27 DIAGNOSIS — R079 Chest pain, unspecified: Secondary | ICD-10-CM

## 2015-04-27 DIAGNOSIS — R002 Palpitations: Secondary | ICD-10-CM

## 2015-04-27 MED ORDER — METOPROLOL TARTRATE 25 MG PO TABS: 12.5000 mg | ORAL_TABLET | Freq: Two times a day (BID) | ORAL | Status: DC

## 2015-04-27 MED ORDER — APIXABAN 5 MG PO TABS: 5.0000 mg | ORAL_TABLET | Freq: Two times a day (BID) | ORAL | Status: DC

## 2015-04-27 NOTE — Telephone Encounter (Signed)
Would schedule fu paov next week Kirk Ruths

## 2015-04-27 NOTE — Progress Notes (Signed)
Patient here for NV  EKG as instructed by Jake Shark last night for irregular heart rate.  C/o feeling weak.  No chest pain, shortness of breath or dizziness.  EKG complete.  Reviewed by DOD, Dr. Odette Horns 5 mg ordered

## 2015-04-27 NOTE — Telephone Encounter (Signed)
Patient given samples of Eliquis along with ordering from pharmacy. RX for metoprolol sent to [name]. EKG complete and event monitor placed  Patient needs a follow appointment as requested by [name] with [name].  Will need prior to next Friday 9/16

## 2015-04-27 NOTE — Patient Instructions (Signed)
Your Doctor has ordered you to wear a heart monitor. You will wear this for 30 days.   TIPS -  REMINDERS 1. The sensor is the lanyard that is worn around your neck every day - this is powered by a battery that needs to be changed every day 2. The monitor is the device that allows you to record symptoms - this will need to be charged daily 3. The sensor & monitor need to be within 100 feet of each other at all times 4. The sensor connects to the electrodes (stickers) - these should be changed every 24-48 hours (you do not have to remove them when you bathe, just make sure they are dry when you connect it back to the sensor 5. If you need more supplies (electrodes, batteries), please call the 1-800 # on the back of the pamphlet and [employer] will mail you more supplies 6. If your skin becomes sensitive, please try the sample pack of sensitive skin electrodes (the white packet in your silver box) and call [employer] to have them mail you more of these type of electrodes 7. When you are finish wearing the monitor, please place all supplies back in the silver box, place the silver box in the pre-packaged UPS bag and drop off at [employer] or call them so they can come pick it up   Cardiac Event Monitoring A cardiac event monitor is a small recording device used to help detect abnormal heart rhythms (arrhythmias). The monitor is used to record heart rhythm when noticeable symptoms such as the following occur:  Fast heartbeats (palpitations), such as heart racing or fluttering.  Dizziness.  Fainting or light-headedness.  Unexplained weakness. The monitor is wired to two electrodes placed on your chest. Electrodes are flat, sticky disks that attach to your skin. The monitor can be worn for up to 30 days. You will wear the monitor at all times, except when bathing.  HOW TO USE YOUR CARDIAC EVENT MONITOR A technician will prepare your chest for the electrode placement. The technician will show you how to place  the electrodes, how to work the monitor, and how to replace the batteries. Take time to practice using the monitor before you leave the office. Make sure you understand how to send the information from the monitor to your health care provider. This requires a telephone with a landline, not a cell phone. You need to:  Wear your monitor at all times, except when you are in water:  Do not get the monitor wet.  Take the monitor off when bathing. Do not swim or use a hot tub with it on.  Keep your skin clean. Do not put body lotion or moisturizer on your chest.  Change the electrodes daily or any time they stop sticking to your skin. You might need to use tape to keep them on.  It is possible that your skin under the electrodes could become irritated. To keep this from happening, try to put the electrodes in slightly different places on your chest. However, they must remain in the area under your left breast and in the upper right section of your chest.  Make sure the monitor is safely clipped to your clothing or in a location close to your body that your health care provider recommends.  Press the button to record when you feel symptoms of heart trouble, such as dizziness, weakness, light-headedness, palpitations, thumping, shortness of breath, unexplained weakness, or a fluttering or racing heart. The monitor is always   on and records what happened slightly before you pressed the button, so do not worry about being too late to get good information.  Keep a diary of your activities, such as walking, doing chores, and taking medicine. It is especially important to note what you were doing when you pushed the button to record your symptoms. This will help your health care provider determine what might be contributing to your symptoms. The information stored in your monitor will be reviewed by your health care provider alongside your diary entries.  Send the recorded information as recommended by your  health care provider. It is important to understand that it will take some time for your health care provider to process the results.  Change the batteries as recommended by your health care provider. SEEK IMMEDIATE MEDICAL CARE IF:   You have chest pain.  You have extreme difficulty breathing or shortness of breath.  You develop a very fast heartbeat that persists.  You develop dizziness that does not go away.  You faint or constantly feel you are about to faint. Document Released: 05/14/2008 Document Revised: 12/20/2013 Document Reviewed: 02/01/2013 [employer] Patient Information 2015 [employer]. This information is not intended to replace advice given to you by your health care provider. Make sure you discuss any questions you have with your health care provider.  

## 2015-04-27 NOTE — Telephone Encounter (Signed)
Patient has OV 9/14

## 2015-04-28 ENCOUNTER — Telehealth: Payer: Self-pay | Admitting: Cardiology

## 2015-04-28 NOTE — Telephone Encounter (Signed)
Pt had a monitor put on yesterday,he needs to ask a question.

## 2015-04-28 NOTE — Telephone Encounter (Signed)
Patient had a question about monitor that beeped twice and wanted him to record a symptom.  Advised patient, also said if there are questions related to card[employer] monitor to feel comfortable calling monitor company, if it is about rate or symptoms he needs to call us or the on call for the w/e

## 2015-05-01 ENCOUNTER — Other Ambulatory Visit: Payer: Self-pay

## 2015-05-01 ENCOUNTER — Ambulatory Visit (HOSPITAL_COMMUNITY): Payer: Medicare Other | Attending: Cardiovascular Disease

## 2015-05-01 DIAGNOSIS — I340 Nonrheumatic mitral (valve) insufficiency: Secondary | ICD-10-CM | POA: Insufficient documentation

## 2015-05-01 DIAGNOSIS — R002 Palpitations: Secondary | ICD-10-CM | POA: Diagnosis not present

## 2015-05-01 DIAGNOSIS — I517 Cardiomegaly: Secondary | ICD-10-CM | POA: Insufficient documentation

## 2015-05-01 DIAGNOSIS — R079 Chest pain, unspecified: Secondary | ICD-10-CM

## 2015-05-01 DIAGNOSIS — I10 Essential (primary) hypertension: Secondary | ICD-10-CM | POA: Insufficient documentation

## 2015-05-02 DIAGNOSIS — F4322 Adjustment disorder with anxiety: Secondary | ICD-10-CM | POA: Diagnosis not present

## 2015-05-03 ENCOUNTER — Ambulatory Visit (INDEPENDENT_AMBULATORY_CARE_PROVIDER_SITE_OTHER): Payer: Medicare Other | Admitting: Physician Assistant

## 2015-05-03 ENCOUNTER — Encounter: Payer: Self-pay | Admitting: Physician Assistant

## 2015-05-03 VITALS — BP 124/68 | HR 53 | Ht 70.0 in | Wt 186.2 lb

## 2015-05-03 DIAGNOSIS — R002 Palpitations: Secondary | ICD-10-CM | POA: Diagnosis not present

## 2015-05-03 MED ORDER — CARVEDILOL 6.25 MG PO TABS: ORAL_TABLET | ORAL | Status: DC

## 2015-05-03 MED ORDER — APIXABAN 5 MG PO TABS: 5.0000 mg | ORAL_TABLET | Freq: Two times a day (BID) | ORAL | Status: DC

## 2015-05-03 NOTE — Patient Instructions (Signed)
Medication Instructions:  Your physician has recommended you make the following change in your medication:  1.  Start Coreg (Carvedilol) 6.25 taking 1/2 tablet 2 times a day for 1 week then take 1 tablet 2 times a day  2.  Decrease the Azilsartan Medoxomil to only as needed for hypertension   Labwork: None ordered  Testing/Procedures: None ordered  Follow-Up: Your physician recommends that you keep your follow-up appointment with [name] 07-03-15 @ 8:00 @ the [place] office.   Any Other Special Instructions Will Be Listed Below (If Applicable).

## 2015-05-03 NOTE — Progress Notes (Signed)
Cardiology Office Note   Date:  05/03/2015   ID:  Nicholas Wood, Dr., DOB June 14, 1936, MRN [id number]  PCP:  Nicholas Pel, MD  Cardiologist:  Dr Nicholas Evener, PA-C   No chief complaint on file.   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   Seen by Dr Nicholas Wood 08/19 and echo, event monitor plus ETT scheduled. He has had the echocardiogram but cannot yet do the treadmill due to ongoing recovery from ankle surgery.  Nicholas Wood, Dr. presents for follow-up evaluation after being started on a beta blocker and having a neck.  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  Since being started on a beta blocker, he is noted multiple heart rates in the 50s and some in the 40s. He has noticed increasing fatigue and a decreased energy level. He has not had any chest pain. He has had 2 episodes of palpitations, both after exertion. The longer one lasted 3 hours. He was symptomatic with this.  He has several questions about arrhythmia and medication options.   Past Medical History  Diagnosis Date  . Chest pain   . Hypertension   . PAT (paroxysmal atrial tachycardia)     Past Surgical History  Procedure Laterality Date  . Transurethral resection of prostate  07/01/2002    Benign prostatic hypertrophy with bladder outlet obstruction  . Total ankle replacement    . Tonsillectomy      Current Outpatient Prescriptions  Medication Sig Dispense Refill  . apixaban (ELIQUIS) 5 MG TABS tablet Take 1 tablet (5 mg total) by mouth 2 (two) times daily. 60 tablet 6  . Azilsartan Medoxomil (EDARBI) 40 MG TABS Take 1 tablet by mouth daily as needed.    . cyanocobalamin (,VITAMIN B-12,) 1000 MCG/ML injection Inject 1,000 mcg into the muscle every 30 (thirty) days.    Marland Kitchen FOLIC ACID PO Take 1 tablet by mouth daily.      .  metoprolol tartrate (LOPRESSOR) 25 MG tablet Take 0.5 tablets (12.5 mg total) by mouth 2 (two) times daily. 180 tablet 3  . Zolpidem Tartrate (AMBIEN PO) Take 1 tablet by mouth as needed (for sleep).     . carvedilol (COREG) 6.25 MG tablet Take 1/2 tablet by mouth twice a day for 1 week then take 1 tablet by mouth twice a day 60 tablet 3   No current facility-administered medications for this visit.    Allergies:   Review of patient's allergies indicates no known allergies.    Social History:  The patient  reports that he has never smoked. He has never used smokeless tobacco. He reports that he drinks alcohol. He reports that he does not use illicit drugs.   Family History:  The patient's family history includes Alzheimer's disease in his mother; Diabetes in his daughter; Heart failure in his father.    ROS:  Please see the history of present illness. All other systems are reviewed and negative.    PHYSICAL EXAM: VS:  BP 124/68 mmHg  Pulse 53  Ht 5\' 10"  (1.778 m)  Wt 186 lb 3.2 oz (84.46 kg)  BMI 26.72 kg/m2  SpO2 97% , BMI Body mass index is 26.72 kg/(m^2). GEN: Well nourished, well developed, in no acute distress HEENT: normal Neck: no JVD, carotid bruits, or masses Cardiac: RRR; no murmurs,  rubs, or gallops,no edema  Respiratory:  clear to auscultation bilaterally, normal work of breathing GI: soft, nontender, nondistended, + BS MS: no deformity or atrophy Skin: warm and dry, no rash Neuro:  Strength and sensation are intact Psych: euthymic mood, full affect   EKG:  EKG is not ordered today.  ECHO: 05/01/2015 - Left ventricle: The cavity size was normal. Wall thickness was increased in a pattern of mild LVH. There was focal basal hypertrophy. Systolic function was normal. The estimated ejection fraction was in the range of 55% to 65%. - Mitral valve: There was mild regurgitation. - Left atrium: The atrium was mildly dilated. - Right atrium: The atrium was  moderately dilated. - Pulmonary arteries: Systolic pressure was mildly increased. PA peak pressure: 32 mm Hg (S).  Recent Labs: None  Wt Readings from Last 3 Encounters:  05/03/15 186 lb 3.2 oz (84.46 kg)  04/27/15 180 lb (81.647 kg)  04/12/15 184 lb 14.4 oz (83.87 kg)     Other studies Reviewed: Additional studies/ records that were reviewed today include: Previous office visits, ECGs, monitor results.  ASSESSMENT AND PLAN:  1. Palpitations: He has documented atrial tachycardia but is not tolerating beta blocker well. Case reviewed with Dr. Angelena Wood. He recommends changing the beta blocker or going to a calcium channel blocker.  Reviewed options with Dr. Burt Wood. For now, we will change the beta blocker to carvedilol 6.25 mg twice a day, start with one half tablet at a time. Okay to take an extra one half tablet if he has palpitations.   If this is not tolerated well, we'll change to Cardizem but his resting bradycardia limits options. Would therefore use Cardizem SR 60 mg twice a day.  Reviewed the event monitor results once available and make sure that he has no other more malignant arrhythmias.  May need EP referral in the future if symptoms become more frequent or we are unable to control them. However, I reassured the patient that his current level of symptoms does not require any additional procedures at this time.  Current medicines are reviewed at length with the patient today.  The patient has concerns regarding medicines. All questions were answered  The following changes have been made:  Change metoprolol to carvedilol  Labs/ tests ordered today include:  No orders of the defined types were placed in this encounter.     Disposition:   FU with Dr. Stanford Wood   Signed, Nicholas Wood  05/03/2015 12:03 PM    [address], [place],   [address] Phone: [phone]; Fax: [phone]

## 2015-05-04 ENCOUNTER — Telehealth: Payer: Self-pay

## 2015-05-04 ENCOUNTER — Inpatient Hospital Stay (HOSPITAL_COMMUNITY): Admission: RE | Admit: 2015-05-04 | Payer: Medicare Other | Source: Ambulatory Visit

## 2015-05-04 ENCOUNTER — Telehealth: Payer: Self-pay | Admitting: Physician Assistant

## 2015-05-04 NOTE — Telephone Encounter (Signed)
Suanne Marker, please see message below from [name].  Does he just need to take an extra 1/2 of the Coreg?  Please advise!  Thanks!

## 2015-05-04 NOTE — Telephone Encounter (Signed)
Received PA request from Kristopher Oppenheim for Eliquis 5mg . Need valid diagnosis to attempt an auth. Message to Merita Norton, PA

## 2015-05-04 NOTE — Telephone Encounter (Signed)
220pm pt went into Afib , was told by Lauraine Rinne to call if any problems, pls advise

## 2015-05-05 ENCOUNTER — Telehealth: Payer: Self-pay | Admitting: Physician Assistant

## 2015-05-05 NOTE — Telephone Encounter (Signed)
Patient of [name] who has h/o PAT on eliquis. I did not find a diagnosis of PAF, but he is on eliquis. He has h/o HTN. His metoprolol was recently changed to coreg as he did not tolerate metoprolol very well.   He paged the after hour answering service for tachycardia this morning. He says his HR was 130 this morning at 7AM. He is wearing a monitor. I have called the Monitor service at [phone]. Per report, he was in sinus bradycardia with HR 50s on 9/14, but around 10AM on 9/15, he went into fast rhythm with HR 140s, max HR was around 12:47midnight in AM of 9/16 when his HR was 160s. It appears to be afib. Given this new information, i have advised him to seek medical attention locally.  Per pt, he is currently in [facility].  [name] PA Pager: [phone]

## 2015-05-05 NOTE — Telephone Encounter (Signed)
Patient called the on call service last night to follow up. He spoke with Almyra Deforest, PA. Isaac Laud called the monitor service for the event monitor that he is wearing- there was documentation of a-fib with rapid rates. He advised the patient to seek medical attention locally as he is in [place], [place] currently.

## 2015-05-08 ENCOUNTER — Telehealth: Payer: Self-pay | Admitting: Cardiology

## 2015-05-08 ENCOUNTER — Telehealth: Payer: Self-pay

## 2015-05-08 ENCOUNTER — Ambulatory Visit: Payer: Medicare Other | Admitting: Cardiology

## 2015-05-08 NOTE — Telephone Encounter (Signed)
Make sure patient has fu ov Kirk Ruths

## 2015-05-08 NOTE — Telephone Encounter (Signed)
Urgent Ekg Results .Marland Kitchen

## 2015-05-08 NOTE — Telephone Encounter (Signed)
Cardionet called with urgent message that paitnet had 120 seconds of A flutter rate of 150 bpm  No VM set up on home or mobile.  No answer.  Calling to check to see if he had any symptoms during episode  Routed to [name] and DOD of day Dr. Oval Linsey

## 2015-05-08 NOTE — Telephone Encounter (Signed)
[name] as scheduled Tykiera Raven   

## 2015-05-08 NOTE — Telephone Encounter (Signed)
Patient called back and said during the episode of rapid a flutter today of 150 he did not have any symptoms or notice his heart rate to be elevated  Was lecturing at the time of recorded occurrence  Event monitor in place until 10/12.  OV scheduled for 06/20/15.  Do you want the OV scheduled sooner?

## 2015-05-08 NOTE — Telephone Encounter (Signed)
Prior auth for Eliquis sent to [facility] 

## 2015-05-09 ENCOUNTER — Telehealth: Payer: Self-pay

## 2015-05-09 ENCOUNTER — Telehealth: Payer: Self-pay | Admitting: Cardiology

## 2015-05-09 NOTE — Telephone Encounter (Signed)
Follow up scheduled 07/03/2015

## 2015-05-09 NOTE — Telephone Encounter (Signed)
Continue present meds and follow BP Nicholas Wood  

## 2015-05-09 NOTE — Telephone Encounter (Signed)
Patient has not been taking Azilsartan Medoxomil for about a week and wants to know if he should be taking this.  Increases his Carvedilol from 1/2 to 1 tablet of 6.25 mg starting tomorrow.  Had Metoprolol DCd do to intolerance and started on the Carvedilol  BP running 140/70  Is on event monitor and has had a couple episodes of elevated rate

## 2015-05-09 NOTE — Telephone Encounter (Signed)
Returning your call. °

## 2015-05-09 NOTE — Telephone Encounter (Signed)
Spoke with pt, Aware of dr crenshaw's recommendations.  °

## 2015-05-09 NOTE — Telephone Encounter (Signed)
Eliquis 5 mg approved through [facility]. Good for one year/through 02/05/2016. [place] [id number]

## 2015-05-11 ENCOUNTER — Telehealth: Payer: Self-pay

## 2015-05-11 NOTE — Telephone Encounter (Signed)
Patient said while waiting for flight returning from [place] his heart rate elevated to 120 to 140 for three hours.  Did not have event monitor on because of flight.  Turned back and thinks that the event monitor might of caught some.  No hard copies present in our file faxed from [place].  Urinated apx 1.5 L with episode which is not uncommon  Has OV tomorrow with Dr. Junius Roads and wanted to make sure the echo results where in the computer which they are

## 2015-05-11 NOTE — Telephone Encounter (Signed)
Fu with me as scheduled or pa if needed earlier Kirk Ruths

## 2015-05-12 DIAGNOSIS — I10 Essential (primary) hypertension: Secondary | ICD-10-CM | POA: Diagnosis not present

## 2015-05-12 NOTE — Telephone Encounter (Signed)
Spoke with pt, Aware of dr Jacalyn Lefevre recommendations.  He will call back if sooner appt is needed.

## 2015-05-16 DIAGNOSIS — R262 Difficulty in walking, not elsewhere classified: Secondary | ICD-10-CM | POA: Diagnosis not present

## 2015-05-16 DIAGNOSIS — Z96661 Presence of right artificial ankle joint: Secondary | ICD-10-CM | POA: Diagnosis not present

## 2015-05-17 DIAGNOSIS — Z96661 Presence of right artificial ankle joint: Secondary | ICD-10-CM | POA: Diagnosis not present

## 2015-05-17 DIAGNOSIS — M25571 Pain in right ankle and joints of right foot: Secondary | ICD-10-CM | POA: Diagnosis not present

## 2015-05-17 DIAGNOSIS — G8929 Other chronic pain: Secondary | ICD-10-CM | POA: Diagnosis not present

## 2015-05-17 DIAGNOSIS — M1990 Unspecified osteoarthritis, unspecified site: Secondary | ICD-10-CM | POA: Diagnosis not present

## 2015-05-17 DIAGNOSIS — M19071 Primary osteoarthritis, right ankle and foot: Secondary | ICD-10-CM | POA: Diagnosis not present

## 2015-05-18 ENCOUNTER — Telehealth: Payer: Self-pay | Admitting: Cardiology

## 2015-05-18 DIAGNOSIS — R079 Chest pain, unspecified: Secondary | ICD-10-CM

## 2015-05-18 DIAGNOSIS — R002 Palpitations: Secondary | ICD-10-CM

## 2015-05-18 NOTE — Telephone Encounter (Signed)
Spoke with pt, his bp is running 160 to 170/70's. His pulse is 52 to 53. He reports he is having the palpitations about every 3rd day. He is c/o no energy. His carvedilol is 6.25 mg twice daily. He has had the event monitor on for about 3 weeks now and feels there are rhythms that may need to be addressed on the monitor. Reassurance given to pt if anything abnormal the monitor company will contact us. Called and requested the strips we currently have from the monitor. Will show those strips to [name] tomorrow to see if there is anything to address.  Pt agreed with this plan.

## 2015-05-18 NOTE — Telephone Encounter (Signed)
Patient states he is having blood pressure issues.

## 2015-05-19 MED ORDER — AZILSARTAN MEDOXOMIL 40 MG PO TABS: 1.0000 | ORAL_TABLET | Freq: Every day | ORAL | Status: DC

## 2015-05-19 NOTE — Telephone Encounter (Signed)
Spoke with pt, monitor reviewed by [name] shows atrial fib/flutter. Pt will be seen in the atrial fib clinic Tuesday 05-23-15 @ 11:30am. Directions, parking code and telephone number given to the pt. He reports taking 1/2 of the [facility] tablets this week. He has taken none today. He will take today and will increase to one whole 40 mg tablet once daily. Monitor strips faxed to the a fib clinic. Pt will cont to track bp.

## 2015-05-19 NOTE — Telephone Encounter (Signed)
Pt says his blood pressure was running high last night,it was 190/ 90 to 100.

## 2015-05-23 ENCOUNTER — Ambulatory Visit (HOSPITAL_COMMUNITY)
Admission: RE | Admit: 2015-05-23 | Discharge: 2015-05-23 | Disposition: A | Discharge status: Home / Self Care | Payer: Medicare Other | Source: Ambulatory Visit | Attending: Nurse Practitioner | Admitting: Nurse Practitioner

## 2015-05-23 ENCOUNTER — Telehealth (HOSPITAL_COMMUNITY): Payer: Self-pay | Admitting: *Deleted

## 2015-05-23 VITALS — BP 150/94 | HR 57 | Ht 70.0 in | Wt 185.4 lb

## 2015-05-23 DIAGNOSIS — Z833 Family history of diabetes mellitus: Secondary | ICD-10-CM | POA: Insufficient documentation

## 2015-05-23 DIAGNOSIS — Z7902 Long term (current) use of antithrombotics/antiplatelets: Secondary | ICD-10-CM | POA: Diagnosis not present

## 2015-05-23 DIAGNOSIS — Z8249 Family history of ischemic heart disease and other diseases of the circulatory system: Secondary | ICD-10-CM | POA: Diagnosis not present

## 2015-05-23 DIAGNOSIS — Z96661 Presence of right artificial ankle joint: Secondary | ICD-10-CM | POA: Diagnosis not present

## 2015-05-23 DIAGNOSIS — I4891 Unspecified atrial fibrillation: Secondary | ICD-10-CM | POA: Diagnosis present

## 2015-05-23 DIAGNOSIS — I480 Paroxysmal atrial fibrillation: Secondary | ICD-10-CM | POA: Insufficient documentation

## 2015-05-23 DIAGNOSIS — Z79899 Other long term (current) drug therapy: Secondary | ICD-10-CM | POA: Insufficient documentation

## 2015-05-23 DIAGNOSIS — I10 Essential (primary) hypertension: Secondary | ICD-10-CM | POA: Insufficient documentation

## 2015-05-23 DIAGNOSIS — R262 Difficulty in walking, not elsewhere classified: Secondary | ICD-10-CM | POA: Diagnosis not present

## 2015-05-23 MED ORDER — DILTIAZEM HCL 30 MG PO TABS: ORAL_TABLET | ORAL | Status: DC

## 2015-05-23 NOTE — Patient Instructions (Signed)
Cardizem 30mg  -- take 1 tablet every 4 hours AS NEEDED for afib heart rate near 100 as long as blood pressure >100.   Afib clinic as needed

## 2015-05-23 NOTE — Telephone Encounter (Signed)
Pt called in this afternoon stating he went back into afib around 1pm today after leaving his appointment this morning.  Encouraged patient to take the PRN cardizem that was prescribed at his visit this AM. He stated he had taken a 1/2 tablet about 45 mins ago.  Educated patient that he can take another 1/2 tablet about 2 hours after initial dose if still meeting parameters of medication. Patient will call back if further issues.

## 2015-05-24 ENCOUNTER — Ambulatory Visit (HOSPITAL_COMMUNITY)
Admission: RE | Admit: 2015-05-24 | Discharge: 2015-05-24 | Disposition: A | Discharge status: Home / Self Care | Payer: Medicare Other | Source: Ambulatory Visit | Attending: Nurse Practitioner | Admitting: Nurse Practitioner

## 2015-05-24 ENCOUNTER — Encounter (HOSPITAL_COMMUNITY): Payer: Self-pay | Admitting: Nurse Practitioner

## 2015-05-24 DIAGNOSIS — I480 Paroxysmal atrial fibrillation: Secondary | ICD-10-CM | POA: Diagnosis not present

## 2015-05-24 LAB — CBC
HCT: 43.3 % (ref 39.0–52.0)
Hemoglobin: 14.6 g/dL (ref 13.0–17.0)
MCH: 31.4 pg (ref 26.0–34.0)
MCHC: 33.7 g/dL (ref 30.0–36.0)
MCV: 93.1 fL (ref 78.0–100.0)
Platelets: 129 10*3/uL — ABNORMAL LOW (ref 150–400)
RBC: 4.65 MIL/uL (ref 4.22–5.81)
RDW: 13.2 % (ref 11.5–15.5)
WBC: 4.6 10*3/uL (ref 4.0–10.5)

## 2015-05-24 LAB — COMPREHENSIVE METABOLIC PANEL
ALT: 14 U/L — ABNORMAL LOW (ref 17–63)
AST: 18 U/L (ref 15–41)
Albumin: 4.2 g/dL (ref 3.5–5.0)
Alkaline Phosphatase: 84 U/L (ref 38–126)
Anion gap: 8 (ref 5–15)
BUN: 18 mg/dL (ref 6–20)
CO2: 30 mmol/L (ref 22–32)
Calcium: 9.2 mg/dL (ref 8.9–10.3)
Chloride: 98 mmol/L — ABNORMAL LOW (ref 101–111)
Creatinine, Ser: 1.12 mg/dL (ref 0.61–1.24)
GFR calc Af Amer: >60 mL/min (ref >60)
GFR calc non Af Amer: >60 mL/min (ref >60)
Glucose, Bld: 130 mg/dL — ABNORMAL HIGH (ref 65–99)
Potassium: 3.6 mmol/L (ref 3.5–5.1)
Sodium: 136 mmol/L (ref 135–145)
Total Bilirubin: 0.7 mg/dL (ref 0.3–1.2)
Total Protein: 6.4 g/dL — ABNORMAL LOW (ref 6.5–8.1)

## 2015-05-24 LAB — MAGNESIUM: Magnesium: 2.2 mg/dL (ref 1.7–2.4)

## 2015-05-24 NOTE — Progress Notes (Signed)
Patient ID: Nicholas Wood, Dr., male   DOB: 1935/09/10, 79 y.o.   MRN: [id number]    Primary Care Physician: Horatio Pel, MD Referring Physician:Dr. Sharene Butters, Dr. is a 79 y.o. male with a h/o PAC's, PVC's, PAT with  afib  recently diagnosed with wearing of a event monitor. He is being evaluated today in the afib clinic. He currently is on carvedilol 6.125 mg daily and has noticed decreased episodes since being on BB. He had extensive ankle surgery a few months ago and has been limited in his activity. He drinks minimal alcohol but does remember one episode following drinking one beer. Small amount of caffeine, usually drinks decaf products. Does not snore.Is not overweight. Recent echo showed mildly dilated left atrium. Has been placed on apixaban just about one week ago and admits to missing a few doses. Chadsvasc score is 3 and we discussed stroke risk and importance of taking DOAC on a regular bias. He states years ago he had frequent nose bleeds with asa and had to stop taking. No bleeding issues since then.  Today, he denies symptoms of palpitations, chest pain, shortness of breath, orthopnea, PND, lower extremity edema, dizziness, presyncope, syncope, or neurologic sequela. The patient is tolerating medications without difficulties and is otherwise without complaint today.   Past Medical History  Diagnosis Date  . Chest pain   . Hypertension   . PAT (paroxysmal atrial tachycardia)    Past Surgical History  Procedure Laterality Date  . Transurethral resection of prostate  07/01/2002    Benign prostatic hypertrophy with bladder outlet obstruction  . Total ankle replacement    . Tonsillectomy      Current Outpatient Prescriptions  Medication Sig Dispense Refill  . apixaban (ELIQUIS) 5 MG TABS tablet Take 1 tablet (5 mg total) by mouth 2 (two) times daily. 60 tablet 6  . Azilsartan Medoxomil (EDARBI) 40 MG TABS Take 1 tablet by mouth daily. 30 tablet   .  carvedilol (COREG) 6.25 MG tablet Take 1/2 tablet by mouth twice a day for 1 week then take 1 tablet by mouth twice a day 60 tablet 3  . cyanocobalamin (,VITAMIN B-12,) 1000 MCG/ML injection Inject 1,000 mcg into the muscle every 30 (thirty) days.    Marland Kitchen FOLIC ACID PO Take 1 tablet by mouth daily.      . Zolpidem Tartrate (AMBIEN PO) Take 1 tablet by mouth as needed (for sleep).     Marland Kitchen diltiazem (CARDIZEM) 30 MG tablet Take 1/2-1 tablet every 4 hours AS NEEDED for afib HR near 100 as long as blood pressure >100 45 tablet 1   No current facility-administered medications for this encounter.    No Known Allergies  Social History   Social History  . Marital Status: Married    Spouse Name: N/A  . Number of Children: 3  . Years of Education: N/A   Occupational History  . pharmacy consultant    Social History Main Topics  . Smoking status: Never Smoker   . Smokeless tobacco: Never Used  . Alcohol Use: 0.0 oz/week    0 Standard drinks or equivalent per week     Comment: Occasional  . Drug Use: No  . Sexual Activity: Not on file   Other Topics Concern  . Not on file   Social History Narrative    Family History  Problem Relation Age of Onset  . Heart failure Father   . Alzheimer's disease Mother   . Diabetes  Daughter     Obesity    ROS- All systems are reviewed and negative except as per the HPI above  Physical Exam: Filed Vitals:   05/23/15 1145  BP: 150/94  Pulse: 57  Height: 5\' 10"  (1.778 m)  Weight: 185 lb 6.4 oz (84.097 kg)    GEN- The patient is well appearing, alert and oriented x 3 today.   Head- normocephalic, atraumatic Eyes-  Sclera clear, conjunctiva pink Ears- hearing intact Oropharynx- clear Neck- supple, no JVP Lymph- no cervical lymphadenopathy Lungs- Clear to ausculation bilaterally, normal work of breathing Heart- Regular rate and rhythm, no murmurs, rubs or gallops, PMI not laterally displaced GI- soft, NT, ND, + BS Extremities- no clubbing,  cyanosis, or edema MS- no significant deformity or atrophy Skin- no rash or lesion Psych- euthymic mood, full affect Neuro- strength and sensation are intact  EKG- SB at 57 bpm, Pr int 190 ms, QRS int 96 ms, QTc 434. Cardionet strips reviewed Epic records reviewed   Assessment and Plan: 1. PAF Options of controlling afib including rate control and rhythm control discussed with pt. He is not very excited re antiarrythmic options and for now.  Wants to continue on rate control. Will continue xarelto and encouraged to take on a regular basis to reduce stroke risk.  Will rx cardizem 30 mg to use as pill in pocket.  Geroge Baseman Dorthula Bier, [facility] [address] [place],  [address] [phone]   Addendum- pt called back to office and had returned to afib by that afternoon and this am, rate controlled but he believes he is still in afib. I discussed antiarrythmic's  again and pt wants to try multaq 400 mg bid with food,  but will have to wait until he is in SR to start. He will call the office when that occurs and he will come in for EKG and baseline cbc, bmet and liver panel.

## 2015-05-25 ENCOUNTER — Ambulatory Visit (HOSPITAL_COMMUNITY)
Admission: RE | Admit: 2015-05-25 | Discharge: 2015-05-25 | Disposition: A | Discharge status: Home / Self Care | Payer: Medicare Other | Source: Ambulatory Visit | Attending: Nurse Practitioner | Admitting: Nurse Practitioner

## 2015-05-25 DIAGNOSIS — R262 Difficulty in walking, not elsewhere classified: Secondary | ICD-10-CM | POA: Diagnosis not present

## 2015-05-25 DIAGNOSIS — I480 Paroxysmal atrial fibrillation: Secondary | ICD-10-CM | POA: Insufficient documentation

## 2015-05-25 DIAGNOSIS — Z96661 Presence of right artificial ankle joint: Secondary | ICD-10-CM | POA: Diagnosis not present

## 2015-05-25 MED ORDER — DRONEDARONE HCL 400 MG PO TABS: 400.0000 mg | ORAL_TABLET | Freq: Two times a day (BID) | ORAL | Status: DC

## 2015-05-25 MED ORDER — CARVEDILOL 6.25 MG PO TABS: 3.1250 mg | ORAL_TABLET | Freq: Two times a day (BID) | ORAL | Status: DC

## 2015-05-25 NOTE — Progress Notes (Addendum)
Patient in for repeat EKG to start Multaq if in NSR.  Nicholas Palau NP to review EKG  NSR by EKG so will start multaq to help decrease afib burden. V rate 61 bpm, Pr int 192 ms, Qrs int 90 ms, QTc 428 ms. He will decrease carvedilol to 3.125 mg bid. Recheck in one week. Take with food to offset any GI complaints.

## 2015-05-25 NOTE — Patient Instructions (Signed)
Your physician has recommended you make the following change in your medication:  1)Decrease coreg to 3.125mg  (1/2 of the 6.25mg  tablet) twice a day. 2)Multaq 400mg  twice a day with food.

## 2015-05-26 ENCOUNTER — Encounter (HOSPITAL_COMMUNITY): Payer: Medicare Other | Admitting: Nurse Practitioner

## 2015-05-30 DIAGNOSIS — Z96661 Presence of right artificial ankle joint: Secondary | ICD-10-CM | POA: Diagnosis not present

## 2015-05-30 DIAGNOSIS — R262 Difficulty in walking, not elsewhere classified: Secondary | ICD-10-CM | POA: Diagnosis not present

## 2015-05-31 ENCOUNTER — Ambulatory Visit (HOSPITAL_COMMUNITY)
Admission: RE | Admit: 2015-05-31 | Discharge: 2015-05-31 | Disposition: A | Discharge status: Home / Self Care | Payer: Medicare Other | Source: Ambulatory Visit | Attending: Nurse Practitioner | Admitting: Nurse Practitioner

## 2015-05-31 DIAGNOSIS — I493 Ventricular premature depolarization: Secondary | ICD-10-CM | POA: Diagnosis not present

## 2015-05-31 DIAGNOSIS — R9431 Abnormal electrocardiogram [ECG] [EKG]: Secondary | ICD-10-CM | POA: Diagnosis not present

## 2015-05-31 DIAGNOSIS — I480 Paroxysmal atrial fibrillation: Secondary | ICD-10-CM

## 2015-05-31 DIAGNOSIS — I4891 Unspecified atrial fibrillation: Secondary | ICD-10-CM | POA: Insufficient documentation

## 2015-05-31 NOTE — Progress Notes (Addendum)
Pt in for ekg after starting multaq.  Roderic Palau NP to review EKG.  Multaq 400 mg bid started and pt in today for EKG on drug. Intervals are normal and in SR.Marland Kitchen Feels a little tired on multaq but has not had any significant afib since on drug. F/u in clinic in one week prior to going out of town.

## 2015-06-08 ENCOUNTER — Ambulatory Visit (HOSPITAL_COMMUNITY)
Admission: RE | Admit: 2015-06-08 | Discharge: 2015-06-08 | Disposition: A | Discharge status: Home / Self Care | Payer: Medicare Other | Source: Ambulatory Visit | Attending: Nurse Practitioner | Admitting: Nurse Practitioner

## 2015-06-08 ENCOUNTER — Encounter (HOSPITAL_COMMUNITY): Payer: Self-pay | Admitting: Nurse Practitioner

## 2015-06-08 VITALS — BP 132/76 | HR 72 | Ht 70.0 in | Wt 188.0 lb

## 2015-06-08 DIAGNOSIS — I480 Paroxysmal atrial fibrillation: Secondary | ICD-10-CM | POA: Diagnosis not present

## 2015-06-08 NOTE — Progress Notes (Addendum)
Patient ID: Nicholas Wood, Dr., male   DOB: February 17, 1936, 79 y.o.   MRN: [id number]    Primary Care Physician: Horatio Pel, MD Referring Physician:Dr. Sharene Butters, Dr. is a 79 y.o. male with a h/o PAC's, PVC's, PAT with  afib  recently diagnosed with wearing of a event monitor. He is being evaluated today in the afib clinic. He currently is on carvedilol 6.125 mg daily and has noticed decreased episodes since being on BB. He had extensive ankle surgery a few months ago and has been limited in his activity. He drinks minimal alcohol but does remember one episode following drinking one beer. Small amount of caffeine, usually drinks decaf products. Does not snore.Is not overweight. Recent echo showed mildly dilated left atrium. Has been placed on apixaban just about one week ago and admits to missing a few doses. Chadsvasc score is 3 and we discussed stroke risk and importance of taking DOAC on a regular bias. He states years ago he had frequent nose bleeds with asa and had to stop taking. No bleeding issues since then.  He initially did not want rhythm control but after office visit, call back and said that he was having more afib despite addition of bb. When EKG confirmed SR, he was started on multaq 400 mg bid. He does notice some fatigue on multaq, and reports that he just came off 2-3 days of afib. It was discussed that he may require another antiarrythmic, if not maintaining SR for the most part. Flecainide was discussed as next option. He denies CAD, LV function normal and no baseline conduction issues. He is going out of town tomorrow for a week in [place], [place], and does not want to change drug at this point.  He will follow up with [name] 11/14 and can discuss further.  Today, he denies symptoms of palpitations, chest pain, shortness of breath, orthopnea, PND, lower extremity edema, dizziness, presyncope, syncope, or neurologic sequela. The patient is tolerating  medications without difficulties and is otherwise without complaint today.   Past Medical History  Diagnosis Date  . Chest pain   . Hypertension   . PAT (paroxysmal atrial tachycardia) [facility])    Past Surgical History  Procedure Laterality Date  . Transurethral resection of prostate  07/01/2002    Benign prostatic hypertrophy with bladder outlet obstruction  . Total ankle replacement    . Tonsillectomy      Current Outpatient Prescriptions  Medication Sig Dispense Refill  . apixaban (ELIQUIS) 5 MG TABS tablet Take 1 tablet (5 mg total) by mouth 2 (two) times daily. 60 tablet 6  . Azilsartan Medoxomil (EDARBI) 40 MG TABS Take 1 tablet by mouth daily. 30 tablet   . carvedilol (COREG) 6.25 MG tablet Take 0.5 tablets (3.125 mg total) by mouth 2 (two) times daily with a meal. 60 tablet 3  . cyanocobalamin (,VITAMIN B-12,) 1000 MCG/ML injection Inject 1,000 mcg into the muscle every 30 (thirty) days.    Marland Kitchen diltiazem (CARDIZEM) 30 MG tablet Take 1/2-1 tablet every 4 hours AS NEEDED for afib HR near 100 as long as blood pressure >100 45 tablet 1  . dronedarone (MULTAQ) 400 MG tablet Take 1 tablet (400 mg total) by mouth 2 (two) times daily with a meal. 60 tablet 2  . FOLIC ACID PO Take 1 tablet by mouth daily.      . Zolpidem Tartrate (AMBIEN PO) Take 1 tablet by mouth as needed (for sleep).  No current facility-administered medications for this encounter.    No Known Allergies  Social History   Social History  . Marital Status: Married    Spouse Name: N/A  . Number of Children: 3  . Years of Education: N/A   Occupational History  . pharmacy consultant    Social History Main Topics  . Smoking status: Never Smoker   . Smokeless tobacco: Never Used  . Alcohol Use: 0.0 oz/week    0 Standard drinks or equivalent per week     Comment: Occasional  . Drug Use: No  . Sexual Activity: Not on file   Other Topics Concern  . Not on file   Social History Narrative    Family  History  Problem Relation Age of Onset  . Heart failure Father   . Alzheimer's disease Mother   . Diabetes Daughter     Obesity    ROS- All systems are reviewed and negative except as per the HPI above  Physical Exam: Filed Vitals:   06/08/15 0909  BP: 132/76  Pulse: 72  Height: 5\' 10"  (1.778 m)  Weight: 188 lb (85.276 kg)    GEN- The patient is well appearing, alert and oriented x 3 today.   Head- normocephalic, atraumatic Eyes-  Sclera clear, conjunctiva pink Ears- hearing intact Oropharynx- clear Neck- supple, no JVP Lymph- no cervical lymphadenopathy Lungs- Clear to ausculation bilaterally, normal work of breathing Heart- Regular rate and rhythm, no murmurs, rubs or gallops, PMI not laterally displaced GI- soft, NT, ND, + BS Extremities- no clubbing, cyanosis, or edema MS- no significant deformity or atrophy Skin- no rash or lesion Psych- euthymic mood, full affect Neuro- strength and sensation are intact  EKG- SR at 72 bpm, pr int 200 ms, Qrs int 102 ms, Qtc 420 ms Cardionet strips reviewed Epic records reviewed   Assessment and Plan: 1. PAF For now continue with multaq 400 mg bid, due to pt being out of town next week, but  If continues to have increased afib burden on drug will need a washout and consideration for another AAD, flecainide may be next option Will continue xarelto and encouraged to take on a regular basis to reduce stroke risk.  Will rx cardizem 30 mg to use as pill in pocket.  F/u with [name] as scheduled 11/14 Afib clinic as needed  Geroge Baseman. Sargon Scouten, [facility] [address] [place], [place] [address] [phone]

## 2015-06-19 DIAGNOSIS — I10 Essential (primary) hypertension: Secondary | ICD-10-CM | POA: Diagnosis not present

## 2015-06-19 DIAGNOSIS — I480 Paroxysmal atrial fibrillation: Secondary | ICD-10-CM | POA: Diagnosis not present

## 2015-06-20 ENCOUNTER — Ambulatory Visit: Payer: Medicare Other | Admitting: Cardiology

## 2015-06-20 DIAGNOSIS — Z96661 Presence of right artificial ankle joint: Secondary | ICD-10-CM | POA: Diagnosis not present

## 2015-06-20 DIAGNOSIS — R262 Difficulty in walking, not elsewhere classified: Secondary | ICD-10-CM | POA: Diagnosis not present

## 2015-06-22 DIAGNOSIS — Z7901 Long term (current) use of anticoagulants: Secondary | ICD-10-CM | POA: Diagnosis not present

## 2015-06-22 DIAGNOSIS — I480 Paroxysmal atrial fibrillation: Secondary | ICD-10-CM | POA: Diagnosis not present

## 2015-06-22 DIAGNOSIS — I10 Essential (primary) hypertension: Secondary | ICD-10-CM | POA: Diagnosis not present

## 2015-07-03 ENCOUNTER — Ambulatory Visit: Payer: Medicare Other | Admitting: Cardiology

## 2015-07-24 DIAGNOSIS — Z125 Encounter for screening for malignant neoplasm of prostate: Secondary | ICD-10-CM | POA: Diagnosis not present

## 2015-07-24 DIAGNOSIS — I10 Essential (primary) hypertension: Secondary | ICD-10-CM | POA: Diagnosis not present

## 2015-07-26 ENCOUNTER — Encounter (HOSPITAL_COMMUNITY): Payer: Self-pay | Admitting: Nurse Practitioner

## 2015-07-26 ENCOUNTER — Ambulatory Visit (HOSPITAL_COMMUNITY)
Admission: RE | Admit: 2015-07-26 | Discharge: 2015-07-26 | Disposition: A | Discharge status: Home / Self Care | Payer: Medicare Other | Source: Ambulatory Visit | Attending: Nurse Practitioner | Admitting: Nurse Practitioner

## 2015-07-26 VITALS — BP 126/84 | HR 61 | Ht 70.0 in | Wt 187.8 lb

## 2015-07-26 DIAGNOSIS — I480 Paroxysmal atrial fibrillation: Secondary | ICD-10-CM | POA: Insufficient documentation

## 2015-07-26 NOTE — Patient Instructions (Signed)
Your physician has recommended you make the following change in your medication:  1)Stop coreg

## 2015-07-26 NOTE — Addendum Note (Signed)
Encounter addended by: Sherran Needs, NP on: 07/26/2015 11:00 AM<BR>     Documentation filed: Notes Section

## 2015-07-26 NOTE — Progress Notes (Addendum)
Patient ID: Lolita Cram, Dr., male   DOB: [birth year], 79 y.o.   MRN: [id number]    Primary Care Physician: Horatio Pel, MD Referring Physician:Dr. Sharene Butters, Dr. is a 79 y.o. male with a h/o PAC's, PVC's, PAT with  afib  recently diagnosed with wearing of a event monitor.He was placed on multaq 400 mg bid and has had much less afib burden. He did forget his multaq one night last week and went into afib that lasted 3-4 days, but v rates were only in the 80's.Marland Kitchen He is in NSR today. He is however c/o of fatigue and we discussed that we can try to stop carvedilol to see if helps with having more energy. He had extensive ankle surgery a few months ago and has been limited in his activity, which may also be contributing to his fatigue. Marland Kitchen He drinks minimal alcohol but does remember one episode following drinking one beer. Small amount of caffeine, usually drinks decaf products. Does not snore.Is not overweight. Recent echo showed mildly dilated left atrium. Has been placed on apixaban. Chadsvasc score is 3 and we discussed stroke risk and importance of taking DOAC on a regular bias. He states years ago he had frequent nose bleeds with asa and had to stop taking. No bleeding issues since then.  Today, he denies symptoms of palpitations, chest pain, shortness of breath, orthopnea, PND, lower extremity edema, dizziness, presyncope, syncope, or neurologic sequela. The patient is tolerating medications without difficulties and is otherwise without complaint today.   Past Medical History  Diagnosis Date  . Chest pain   . Hypertension   . PAT (paroxysmal atrial tachycardia) [facility])    Past Surgical History  Procedure Laterality Date  . Transurethral resection of prostate  07/01/2002    Benign prostatic hypertrophy with bladder outlet obstruction  . Total ankle replacement    . Tonsillectomy      Current Outpatient Prescriptions  Medication Sig Dispense Refill  . apixaban  (ELIQUIS) 5 MG TABS tablet Take 1 tablet (5 mg total) by mouth 2 (two) times daily. 60 tablet 6  . Azilsartan Medoxomil (EDARBI) 40 MG TABS Take 1 tablet by mouth daily. 30 tablet   . cyanocobalamin (,VITAMIN B-12,) 1000 MCG/ML injection Inject 1,000 mcg into the muscle every 30 (thirty) days.    Marland Kitchen diltiazem (CARDIZEM) 30 MG tablet Take 1/2-1 tablet every 4 hours AS NEEDED for afib HR near 100 as long as blood pressure >100 45 tablet 1  . dronedarone (MULTAQ) 400 MG tablet Take 1 tablet (400 mg total) by mouth 2 (two) times daily with a meal. 60 tablet 2  . FOLIC ACID PO Take 1 tablet by mouth daily.      . Zolpidem Tartrate (AMBIEN PO) Take 1 tablet by mouth as needed (for sleep).      No current facility-administered medications for this encounter.    No Known Allergies  Social History   Social History  . Marital Status: Married    Spouse Name: N/A  . Number of Children: 3  . Years of Education: N/A   Occupational History  . pharmacy consultant    Social History Main Topics  . Smoking status: Never Smoker   . Smokeless tobacco: Never Used  . Alcohol Use: 0.0 oz/week    0 Standard drinks or equivalent per week     Comment: Occasional  . Drug Use: No  . Sexual Activity: Not on file   Other Topics Concern  .  Not on file   Social History Narrative    Family History  Problem Relation Age of Onset  . Heart failure Father   . Alzheimer's disease Mother   . Diabetes Daughter     Obesity    ROS- All systems are reviewed and negative except as per the HPI above  Physical Exam: Filed Vitals:   07/26/15 1000 07/26/15 1038  BP: 146/88 126/84  Pulse: 61   Height: 5\' 10"  (1.778 m)   Weight: 187 lb 12.8 oz (85.186 kg)     GEN- The patient is well appearing, alert and oriented x 3 today.   Head- normocephalic, atraumatic Eyes-  Sclera clear, conjunctiva pink Ears- hearing intact Oropharynx- clear Neck- supple, no JVP Lymph- no cervical lymphadenopathy Lungs- Clear to  ausculation bilaterally, normal work of breathing Heart- Regular rate and rhythm, no murmurs, rubs or gallops, PMI not laterally displaced GI- soft, NT, ND, + BS Extremities- no clubbing, cyanosis, or edema MS- no significant deformity or atrophy Skin- no rash or lesion Psych- euthymic mood, full affect Neuro- strength and sensation are intact  EKG- NSR, normal EKG, at 61 bpm, pr int 200 ms, Qrs int 102 ms, Qtc 420 m Had pre-physical labs drawn 12/6  by Dr. Shelia Media and liver panel normal, creat 1.3 bun 20, WBC 4.4, RBC 4.03, Hgb 12.4. Lipid panel total chol, 136, trigs 55, HDL 43, LDL 82, psa, 4.68  Assessment and Plan: 1. PAF For now continue with multaq 400 mg bid Will continue xarelto and encouraged to take on a regular basis to reduce stroke risk.  Will stop carvedilol, with c/o fatigue, but will have to wait to see if this helps, and if he has more afib burden off drug. If so, may add a long acting daily cardizem, to see if can tolerate without as much fatigue. If still has fatigue or more afib burden, consider stopping multaq, with washout period, and staring flecainide.   Has f/u with PCP tomorrow Afib clinic in one month, sooner if more afib burden  Butch Penny C. Beda Dula, [facility] [address] [place], [place] [address] [phone]

## 2015-07-27 ENCOUNTER — Ambulatory Visit (HOSPITAL_COMMUNITY): Payer: Medicare Other | Admitting: Nurse Practitioner

## 2015-07-27 DIAGNOSIS — I10 Essential (primary) hypertension: Secondary | ICD-10-CM | POA: Diagnosis not present

## 2015-07-27 DIAGNOSIS — I480 Paroxysmal atrial fibrillation: Secondary | ICD-10-CM | POA: Diagnosis not present

## 2015-08-28 DIAGNOSIS — L309 Dermatitis, unspecified: Secondary | ICD-10-CM | POA: Diagnosis not present

## 2015-08-28 DIAGNOSIS — L570 Actinic keratosis: Secondary | ICD-10-CM | POA: Diagnosis not present

## 2015-08-29 ENCOUNTER — Ambulatory Visit (HOSPITAL_COMMUNITY): Payer: Medicare Other | Admitting: Nurse Practitioner

## 2015-08-31 ENCOUNTER — Encounter: Payer: Self-pay | Admitting: Internal Medicine

## 2015-08-31 ENCOUNTER — Encounter (HOSPITAL_COMMUNITY): Payer: Self-pay | Admitting: Nurse Practitioner

## 2015-08-31 ENCOUNTER — Ambulatory Visit (HOSPITAL_COMMUNITY)
Admission: RE | Admit: 2015-08-31 | Discharge: 2015-08-31 | Disposition: A | Discharge status: Home / Self Care | Payer: Medicare Other | Source: Ambulatory Visit | Attending: Nurse Practitioner | Admitting: Nurse Practitioner

## 2015-08-31 ENCOUNTER — Ambulatory Visit (INDEPENDENT_AMBULATORY_CARE_PROVIDER_SITE_OTHER): Payer: Medicare Other | Admitting: Internal Medicine

## 2015-08-31 VITALS — BP 170/94 | HR 70 | Ht 70.0 in | Wt 189.8 lb

## 2015-08-31 VITALS — BP 146/72 | HR 76 | Ht 70.0 in | Wt 189.8 lb

## 2015-08-31 DIAGNOSIS — I480 Paroxysmal atrial fibrillation: Secondary | ICD-10-CM

## 2015-08-31 DIAGNOSIS — I483 Typical atrial flutter: Secondary | ICD-10-CM | POA: Diagnosis not present

## 2015-08-31 DIAGNOSIS — I10 Essential (primary) hypertension: Secondary | ICD-10-CM

## 2015-08-31 MED ORDER — FLECAINIDE ACETATE 50 MG PO TABS: 50.0000 mg | ORAL_TABLET | Freq: Two times a day (BID) | ORAL | Status: DC

## 2015-08-31 MED ORDER — CARVEDILOL 6.25 MG PO TABS
6.2500 mg | ORAL_TABLET | Freq: Two times a day (BID) | ORAL | Status: DC
Start: 2015-08-31 — End: 2016-02-12

## 2015-08-31 NOTE — Progress Notes (Signed)
Patient ID: Nicholas Wood, Dr., male   DOB: 1936-03-29, 80 y.o.   MRN: [id number]     Primary Care Physician: Horatio Pel, MD Referring Physician: Dr. Berneice Heinrich, Dr. is a 80 y.o. male with a h/o PAF that diagnosed in October when wearing a 30 day monitor for complaints of palpitations. He was placed on multaq which has helped to reduce his afib burden significantly but still has breakthrough afib twice a week for several hours at a time. He stopped BB on last visit due to c/o of fatigue, which may have contributed to more afib. He is also c/o of a photosensitive rash on the backs of his hands since he has been outside lately playing golf. He did get a rx cortisone cream from the dermatologist for this. He notices a huge increase in urination with afib episodes.  Today, he denies symptoms of palpitations, chest pain, shortness of breath, orthopnea, PND, lower extremity edema, dizziness, presyncope, syncope, or neurologic sequela. The patient is tolerating medications without difficulties and is otherwise without complaint today.   Past Medical History  Diagnosis Date  . Chest pain   . Hypertension   . PAT (paroxysmal atrial tachycardia) [facility])    Past Surgical History  Procedure Laterality Date  . Transurethral resection of prostate  07/01/2002    Benign prostatic hypertrophy with bladder outlet obstruction  . Total ankle replacement    . Tonsillectomy      Current Outpatient Prescriptions  Medication Sig Dispense Refill  . apixaban (ELIQUIS) 5 MG TABS tablet Take 1 tablet (5 mg total) by mouth 2 (two) times daily. 60 tablet 6  . Azilsartan Medoxomil (EDARBI) 40 MG TABS Take 1 tablet by mouth daily. 30 tablet   . cyanocobalamin (,VITAMIN B-12,) 1000 MCG/ML injection Inject 1,000 mcg into the muscle every 30 (thirty) days.    Marland Kitchen diltiazem (CARDIZEM) 30 MG tablet Take 1/2-1 tablet every 4 hours AS NEEDED for afib HR near 100 as long as blood pressure >100 45 tablet  1  . FOLIC ACID PO Take 1 tablet by mouth daily.      . Zolpidem Tartrate (AMBIEN PO) Take 1 tablet by mouth as needed (for sleep).     . carvedilol (COREG) 6.25 MG tablet Take 1 tablet (6.25 mg total) by mouth 2 (two) times daily with a meal.     No current facility-administered medications for this encounter.    No Known Allergies  Social History   Social History  . Marital Status: Married    Spouse Name: N/A  . Number of Children: 3  . Years of Education: N/A   Occupational History  . pharmacy consultant    Social History Main Topics  . Smoking status: Never Smoker   . Smokeless tobacco: Never Used  . Alcohol Use: 0.0 oz/week    0 Standard drinks or equivalent per week     Comment: Occasional  . Drug Use: No  . Sexual Activity: Not on file   Other Topics Concern  . Not on file   Social History Narrative    Family History  Problem Relation Age of Onset  . Heart failure Father   . Alzheimer's disease Mother   . Diabetes Daughter     Obesity    ROS- All systems are reviewed and negative except as per the HPI above  Physical Exam: Filed Vitals:   08/31/15 0912  BP: 146/72  Pulse: 76  Height: 5\' 10"  (1.778 m)  Weight: 189 lb 12.8 oz (86.093 kg)    GEN- The patient is well appearing, alert and oriented x 3 today.   Head- normocephalic, atraumatic Eyes-  Sclera clear, conjunctiva pink Ears- hearing intact Oropharynx- clear Neck- supple, no JVP Lymph- no cervical lymphadenopathy Lungs- Clear to ausculation bilaterally, normal work of breathing Heart- Regular rate and rhythm, no murmurs, rubs or gallops, PMI not laterally displaced GI- soft, NT, ND, + BS Extremities- no clubbing, cyanosis, or edema. Rash present dorsal surface bilateral hands MS- no significant deformity or atrophy Skin- no rash or lesion Psych- euthymic mood, full affect Neuro- strength and sensation are intact  EKG-SR with occasional premature ventricular complexes and premature  atrial complexes, pr int 202 ms, qrs int 100 ms, qtc 438 ms   Echo-Left ventricle: The cavity size was normal. Wall thickness was increased in a pattern of mild LVH. There was focal basal hypertrophy. Systolic function was normal. The estimated ejection fraction was in the range of 55% to 65%. - Mitral valve: There was mild regurgitation. - Left atrium: The atrium was mildly dilated, left atrium 42 ms. - Right atrium: The atrium was moderately dilated. - Pulmonary arteries: Systolic pressure was mildly increased. PA peak pressure: 32 mm Hg (S).  Assessment and Plan: 1. Symptomatic PAF Having more breakthrough on multaq and possibly drug related rash Will stop drug, go back on carvedilol 6.25 mg bid Continue apixaban 5 mg bid  I discussed other arrhythmics, possibly flecainide, no known CAD, or pursue ablation He would like to discuss ablation further  Will refer to Dr. Rayann Heman for consideration for ablation. He appears to be a good candidate.   Geroge Baseman Riyah Bardon, [facility] [address] [place], [place] [address] [phone]

## 2015-08-31 NOTE — Patient Instructions (Signed)
Your physician has recommended you make the following change in your medication:  1)Stop multaq  2)Start Coreg 6.25mg  twice daily  Scheduler will be in touch with you regarding appt with Dr. Rayann Heman to discuss ablation.

## 2015-08-31 NOTE — Patient Instructions (Signed)
Medication Instructions:  Your physician has recommended you make the following change in your medication:  1) restart Carvedilol 6.25mg  twice daily 2) Stop Multaq 3) Start Flecainide 50 mg twice daily   Labwork: None ordered   Testing/Procedures: Your physician has recommended that you have an ablation. Catheter ablation is a medical procedure used to treat some cardiac arrhythmias (irregular heartbeats). During catheter ablation, a long, thin, flexible tube is put into a blood vessel in your groin (upper thigh), or neck. This tube is called an ablation catheter. It is then guided to your heart through the blood vessel. Radio frequency waves destroy small areas of heart tissue where abnormal heartbeats may cause an arrhythmia to start. Please see the instruction sheet given to you today.  Please call Janan Halter, RN if you decide to proceed with ablation     Follow-Up: Your physician recommends that you schedule a follow-up appointment in: 4 weeks with Roderic Palau, NP    Any Other Special Instructions Will Be Listed Below (If Applicable).     If you need a refill on your cardiac medications before your next appointment, please call your pharmacy.

## 2015-09-01 ENCOUNTER — Encounter: Payer: Self-pay | Admitting: Internal Medicine

## 2015-09-01 DIAGNOSIS — I4891 Unspecified atrial fibrillation: Secondary | ICD-10-CM | POA: Insufficient documentation

## 2015-09-01 DIAGNOSIS — I483 Typical atrial flutter: Secondary | ICD-10-CM | POA: Insufficient documentation

## 2015-09-01 NOTE — Progress Notes (Signed)
Electrophysiology Office Note   Date:  09/01/2015   ID:  Nicholas Wood, Dr., DOB Dec 23, 1935, MRN [id number]  PCP:  Horatio Pel, MD  Cardiologist:  [name] Primary Electrophysiologist: Thompson Grayer, MD    CC: Afib   History of Present Illness: Nicholas Wood, Dr. is a 80 y.o. male who presents today for electrophysiology evaluation.   The patient has been diagnosed with atrial fibrillation and atrial flutter.  He initially developed palpitations in September 2016.  He wore a 30 day event monitor which documented both atrial fibrillation and atrial flutter.  He was placed on coreg and multaq with some improvement in symptoms.  His coreg was stopped due to fatigue however his fatigue did not resolve.  Presently, he reports having afib twice a week for several hours at a time.  He has developed a rash on the backs of his hands of unclear etiology.  He has seen Dermatology and has been placed on a topical steroid.  There is some concern that his rash could be due to medicines.   Today, he denies symptoms of chest pain, shortness of breath, orthopnea, PND, lower extremity edema, claudication, dizziness, presyncope, syncope, bleeding, or neurologic sequela. The patient is tolerating medications without difficulties and is otherwise without complaint today.    Past Medical History  Diagnosis Date  . Chest pain   . Hypertension   . Paroxysmal atrial fibrillation (HCC)   . Atrial flutter [facility])    Past Surgical History  Procedure Laterality Date  . Transurethral resection of prostate  07/01/2002    Benign prostatic hypertrophy with bladder outlet obstruction  . Total ankle replacement    . Tonsillectomy       Current Outpatient Prescriptions  Medication Sig Dispense Refill  . apixaban (ELIQUIS) 5 MG TABS tablet Take 1 tablet (5 mg total) by mouth 2 (two) times daily. 60 tablet 6  . Azilsartan Medoxomil (EDARBI) 40 MG TABS Take 1 tablet by mouth daily. 30 tablet   .  carvedilol (COREG) 6.25 MG tablet Take 1 tablet (6.25 mg total) by mouth 2 (two) times daily with a meal.    . cyanocobalamin (,VITAMIN B-12,) 1000 MCG/ML injection Inject 1,000 mcg into the muscle every 30 (thirty) days.    Marland Kitchen diltiazem (CARDIZEM) 30 MG tablet Take 1/2-1 tablet every 4 hours AS NEEDED for afib HR near 100 as long as blood pressure >100 45 tablet 1  . FOLIC ACID PO Take 1 tablet by mouth daily.      . Zolpidem Tartrate (AMBIEN PO) Take 1 tablet by mouth as needed (for sleep).     . flecainide (TAMBOCOR) 50 MG tablet Take 1 tablet (50 mg total) by mouth 2 (two) times daily. 180 tablet 3   No current facility-administered medications for this visit.    Allergies:   Review of patient's allergies indicates no known allergies.   Social History:  The patient  reports that he has never smoked. He has never used smokeless tobacco. He reports that he drinks alcohol. He reports that he does not use illicit drugs.   Family History:  The patient's  family history includes Alzheimer's disease in his mother; Diabetes in his daughter; Heart failure in his father.    ROS:  Please see the history of present illness.   All other systems are reviewed and negative.    PHYSICAL EXAM: VS:  BP 170/94 mmHg  Pulse 70  Ht 5\' 10"  (1.778 m)  Wt 189 lb 12.8  oz (86.093 kg)  BMI 27.23 kg/m2  SpO2 99% , BMI Body mass index is 27.23 kg/(m^2). GEN: Well nourished, well developed, in no acute distress HEENT: normal Neck: no JVD, carotid bruits, or masses Cardiac: RRR; no murmurs, rubs, or gallops,no edema  Respiratory:  clear to auscultation bilaterally, normal work of breathing GI: soft, nontender, nondistended, + BS MS: no deformity or atrophy Skin: warm and dry , rash on dorsum of hands Neuro:  Strength and sensation are intact Psych: euthymic mood, full affect  EKG:  EKG today reveals sinus rhythm with frequent pacs and rare pvcs   Recent Labs: 05/24/2015: ALT 14*; BUN 18; Creatinine, Ser  1.12; Hemoglobin 14.6; Magnesium 2.2; Platelets 129*; Potassium 3.6; Sodium 136    Lipid Panel  No results found for: CHOL, TRIG, HDL, CHOLHDL, VLDL, LDLCALC, LDLDIRECT   Wt Readings from Last 3 Encounters:  08/31/15 189 lb 12.8 oz (86.093 kg)  08/31/15 189 lb 12.8 oz (86.093 kg)  07/26/15 187 lb 12.8 oz (85.186 kg)      Other studies Reviewed: Additional studies/ records that were reviewed today include: echo 9/16,  event monitor, AF clinic notes    ASSESSMENT AND PLAN:  1.  Paroxysmal atrial fibrillation and typical appearing atrial flutter The patient has symptomatic atrial arrhythmias.  He has failed medical therapy with coreg and multaq. Therapeutic strategies for afib/ atrial flutter including medicine and ablation were discussed in detail with the patient today.We discussed flecainide, tikosyn, and sotalol.  Risk, benefits, and alternatives to EP study and radiofrequency ablation were also discussed in detail today.  These risks include but are not limited to stroke, bleeding, vascular damage, tamponade, perforation, damage to the esophagus, lungs, and other structures, pulmonary vein stenosis, worsening renal function, and death. The patient understands these risk and wishes to proceed.  We will therefore proceed with catheter ablation at the next available time. Today, I have stopped multaq.  Restart coreg and add flecainide 50mg  BID.  Flecainide can be increased if needed to 100mg  daily.  If he remains on this long term, we will perform GXT testing.  If proceeds with ablation as planned, hopefully we can get him off of flecainide long term.  chads2vasc score is 3.  Continue eliquis  2. HTN Stable No change required today  3. ETOH Moderation encouraged   Current medicines are reviewed at length with the patient today.   The patient does not have concerns regarding his medicines.  The following changes were made today:  none  Labs/ tests ordered today include:  No orders  of the defined types were placed in this encounter.     Signed, Thompson Grayer, MD   [address] [place] [address] [phone] (office) [phone] (fax)

## 2015-09-07 DIAGNOSIS — I471 Supraventricular tachycardia: Secondary | ICD-10-CM | POA: Diagnosis not present

## 2015-09-07 DIAGNOSIS — I480 Paroxysmal atrial fibrillation: Secondary | ICD-10-CM | POA: Diagnosis not present

## 2015-09-27 ENCOUNTER — Other Ambulatory Visit: Payer: Self-pay | Admitting: Internal Medicine

## 2015-09-27 DIAGNOSIS — N62 Hypertrophy of breast: Secondary | ICD-10-CM | POA: Diagnosis not present

## 2015-09-27 DIAGNOSIS — I480 Paroxysmal atrial fibrillation: Secondary | ICD-10-CM | POA: Diagnosis not present

## 2015-09-27 DIAGNOSIS — I10 Essential (primary) hypertension: Secondary | ICD-10-CM | POA: Diagnosis not present

## 2015-09-27 DIAGNOSIS — D649 Anemia, unspecified: Secondary | ICD-10-CM | POA: Diagnosis not present

## 2015-09-27 DIAGNOSIS — N644 Mastodynia: Secondary | ICD-10-CM

## 2015-09-28 ENCOUNTER — Telehealth (HOSPITAL_COMMUNITY): Payer: Self-pay | Admitting: *Deleted

## 2015-09-28 NOTE — Telephone Encounter (Signed)
Im not sure that this is due to any of his medicines. You should check with [name], his primary cardiologist.

## 2015-09-28 NOTE — Telephone Encounter (Addendum)
Patient called in with complaints of swollen breasts and sore/hard nipples which has only started since December and is questioning whether this could be a drug reaction. The rash he had earlier this year disappeared when he stopped Multaq and states he never actually started flecainide due to reading about side effects. He saw his PCP today and they recommended he follow up with cardiology regarding possible drug related side effects. Will forward for review

## 2015-09-28 NOTE — Telephone Encounter (Signed)
I do not see any med that would cause this Kirk Ruths

## 2015-09-29 NOTE — Telephone Encounter (Signed)
Left message informing patient of physician recommendations -- to follow up with PCP for further workup.

## 2015-10-02 ENCOUNTER — Ambulatory Visit
Admission: RE | Admit: 2015-10-02 | Discharge: 2015-10-02 | Disposition: A | Discharge status: Home / Self Care | Payer: Medicare Other | Source: Ambulatory Visit | Attending: Internal Medicine | Admitting: Internal Medicine

## 2015-10-02 ENCOUNTER — Ambulatory Visit: Payer: Medicare Other

## 2015-10-02 DIAGNOSIS — N644 Mastodynia: Secondary | ICD-10-CM | POA: Diagnosis not present

## 2015-10-02 DIAGNOSIS — N62 Hypertrophy of breast: Secondary | ICD-10-CM

## 2015-10-05 ENCOUNTER — Ambulatory Visit (HOSPITAL_COMMUNITY)
Admission: RE | Admit: 2015-10-05 | Discharge: 2015-10-05 | Disposition: A | Discharge status: Home / Self Care | Payer: Medicare Other | Source: Ambulatory Visit | Attending: Nurse Practitioner | Admitting: Nurse Practitioner

## 2015-10-05 VITALS — BP 142/86 | HR 76 | Ht 70.0 in | Wt 185.8 lb

## 2015-10-05 DIAGNOSIS — Z79899 Other long term (current) drug therapy: Secondary | ICD-10-CM | POA: Diagnosis not present

## 2015-10-05 DIAGNOSIS — Z833 Family history of diabetes mellitus: Secondary | ICD-10-CM | POA: Diagnosis not present

## 2015-10-05 DIAGNOSIS — I480 Paroxysmal atrial fibrillation: Secondary | ICD-10-CM

## 2015-10-05 DIAGNOSIS — N62 Hypertrophy of breast: Secondary | ICD-10-CM | POA: Diagnosis not present

## 2015-10-05 DIAGNOSIS — Z7902 Long term (current) use of antithrombotics/antiplatelets: Secondary | ICD-10-CM | POA: Insufficient documentation

## 2015-10-05 DIAGNOSIS — Z8249 Family history of ischemic heart disease and other diseases of the circulatory system: Secondary | ICD-10-CM | POA: Diagnosis not present

## 2015-10-05 DIAGNOSIS — I10 Essential (primary) hypertension: Secondary | ICD-10-CM | POA: Diagnosis not present

## 2015-10-05 NOTE — Progress Notes (Signed)
Patient ID: Lolita Cram, Dr., male   DOB: [birth year], 80 y.o.   MRN: [id number]     Primary Care Physician: Horatio Pel, MD Referring Physician: Dr. Berneice Heinrich, Dr. is a 80 y.o. male with a h/o PAF that diagnosed in October when wearing a 30 day monitor for complaints of palpitations. He was placed on multaq which has helped to reduce his afib burden significantly but still has breakthrough afib twice a week for several hours at a time. He stopped BB on last visit due to c/o of fatigue, which may have contributed to more afib. He is also c/o of a photosensitive rash on the backs of his hands since he has been outside lately playing golf. He did get a rx cortisone cream from the dermatologist for this. He notices a huge increase in urination with afib episodes. Multaq was stopped due to rash.  He was referred to Dr. Rayann Heman who discussed antiarrythmic's as well as ablation.. Pt was undecided and wanted to consider options. He then went on to [facility] for second opinion, 1/17, who also suggested flecainide / tikosyn, or ablation.  Pt is in afib clinic today and states that he still has 2-3 hours of afib a week. Short acting cardizem helps to abbreviate episode. He does not want AAD's or ablation at this time. He has also had issues with gynecomastia and the PCP is doing studies to find out why this is occurring. Is is unlikely his current meds are contributing. He continues with eliquis without bleeding issues.  Today, he denies symptoms of palpitations, chest pain, shortness of breath, orthopnea, PND, lower extremity edema, dizziness, presyncope, syncope, or neurologic sequela. The patient is tolerating medications without difficulties and is otherwise without complaint today.   Past Medical History  Diagnosis Date  . Hypertension   . Paroxysmal atrial fibrillation (HCC)   . Atrial flutter [facility])    Past Surgical History  Procedure Laterality Date  . Transurethral resection  of prostate  07/01/2002    Benign prostatic hypertrophy with bladder outlet obstruction  . Total ankle replacement    . Tonsillectomy      Current Outpatient Prescriptions  Medication Sig Dispense Refill  . apixaban (ELIQUIS) 5 MG TABS tablet Take 1 tablet (5 mg total) by mouth 2 (two) times daily. 60 tablet 6  . Azilsartan Medoxomil (EDARBI) 40 MG TABS Take 1 tablet by mouth daily. 30 tablet   . carvedilol (COREG) 6.25 MG tablet Take 1 tablet (6.25 mg total) by mouth 2 (two) times daily with a meal. (Patient taking differently: Take 6.25 mg by mouth 2 (two) times daily with a meal. Pt is taking half of 6.25 at night)    . cyanocobalamin (,VITAMIN B-12,) 1000 MCG/ML injection Inject 1,000 mcg into the muscle every 30 (thirty) days.    Marland Kitchen diltiazem (CARDIZEM) 30 MG tablet Take 1/2-1 tablet every 4 hours AS NEEDED for afib HR near 100 as long as blood pressure >100 45 tablet 1  . FOLIC ACID PO Take 1 tablet by mouth daily.      . Zolpidem Tartrate (AMBIEN PO) Take 1 tablet by mouth as needed (for sleep).     . flecainide (TAMBOCOR) 50 MG tablet Take 1 tablet (50 mg total) by mouth 2 (two) times daily. (Patient not taking: Reported on 10/05/2015) 180 tablet 3   No current facility-administered medications for this encounter.    No Known Allergies  Social History   Social History  .  Marital Status: Married    Spouse Name: N/A  . Number of Children: 3  . Years of Education: N/A   Occupational History  . pharmacy consultant    Social History Main Topics  . Smoking status: Never Smoker   . Smokeless tobacco: Never Used  . Alcohol Use: 0.0 oz/week    0 Standard drinks or equivalent per week     Comment: Occasional  . Drug Use: No  . Sexual Activity: Not on file   Other Topics Concern  . Not on file   Social History Narrative   Consultant    Family History  Problem Relation Age of Onset  . Heart failure Father   . Alzheimer's disease Mother   . Diabetes Daughter     Obesity     ROS- All systems are reviewed and negative except as per the HPI above  Physical Exam: Filed Vitals:   10/05/15 0905  BP: 142/86  Pulse: 76  Height: 5\' 10"  (1.778 m)  Weight: 185 lb 12.8 oz (84.278 kg)    GEN- The patient is well appearing, alert and oriented x 3 today.   Head- normocephalic, atraumatic Eyes-  Sclera clear, conjunctiva pink Ears- hearing intact Oropharynx- clear Neck- supple, no JVP Lymph- no cervical lymphadenopathy Lungs- Clear to ausculation bilaterally, normal work of breathing Heart- Regular rate and rhythm, no murmurs, rubs or gallops, PMI not laterally displaced GI- soft, NT, ND, + BS Extremities- no clubbing, cyanosis, or edema. Rash present dorsal surface bilateral hands MS- no significant deformity or atrophy Skin- no rash or lesion Psych- euthymic mood, full affect Neuro- strength and sensation are intact  EKG-SR with first degree AVB, pr int 214 ms, qrs int 100 ms, qtc 427 ms Care everywhere reviewed  Echo-Left ventricle: The cavity size was normal. Wall thickness was increased in a pattern of mild LVH. There was focal basal hypertrophy. Systolic function was normal. The estimated ejection fraction was in the range of 55% to 65%. - Mitral valve: There was mild regurgitation. - Left atrium: The atrium was mildly dilated, left atrium 42 ms. - Right atrium: The atrium was moderately dilated. - Pulmonary arteries: Systolic pressure was mildly increased. PA peak pressure: 32 mm Hg (S).  Assessment and Plan: 1. Symptomatic PAF Been seen in consult by Dr. Rayann Heman and Dr. Norm Salt at [facility] with basically the same recommendtions Does not want any further AAD's at this time Does not want ablation Off multaq due to rash Currently content to take 30 mg cardizem as needed Continue carvedilol 6.25 mg bid Continue apixaban 5 mg bid  2. Gynecomastia  Being evaluated by PCP  Afib clinic as needed, as pt does not want to pursue any  other therapy at this time  Geroge Baseman. Carlyn Mullenbach, [facility] [address] [place], [place] [address] [phone]

## 2015-11-03 DIAGNOSIS — N62 Hypertrophy of breast: Secondary | ICD-10-CM | POA: Diagnosis not present

## 2015-11-03 DIAGNOSIS — I480 Paroxysmal atrial fibrillation: Secondary | ICD-10-CM | POA: Diagnosis not present

## 2015-11-03 DIAGNOSIS — I10 Essential (primary) hypertension: Secondary | ICD-10-CM | POA: Diagnosis not present

## 2015-11-03 DIAGNOSIS — N644 Mastodynia: Secondary | ICD-10-CM | POA: Diagnosis not present

## 2015-11-15 DIAGNOSIS — Z96661 Presence of right artificial ankle joint: Secondary | ICD-10-CM | POA: Diagnosis not present

## 2015-11-15 DIAGNOSIS — M25571 Pain in right ankle and joints of right foot: Secondary | ICD-10-CM | POA: Diagnosis not present

## 2016-01-11 DIAGNOSIS — L570 Actinic keratosis: Secondary | ICD-10-CM | POA: Diagnosis not present

## 2016-02-12 ENCOUNTER — Other Ambulatory Visit: Payer: Self-pay | Admitting: Physician Assistant

## 2016-02-21 DIAGNOSIS — H6123 Impacted cerumen, bilateral: Secondary | ICD-10-CM | POA: Diagnosis not present

## 2016-04-12 ENCOUNTER — Other Ambulatory Visit: Payer: Self-pay

## 2016-05-24 ENCOUNTER — Other Ambulatory Visit: Payer: Self-pay | Admitting: Cardiovascular Disease

## 2016-05-24 DIAGNOSIS — Z23 Encounter for immunization: Secondary | ICD-10-CM | POA: Diagnosis not present

## 2016-06-18 DIAGNOSIS — L570 Actinic keratosis: Secondary | ICD-10-CM | POA: Diagnosis not present

## 2016-06-18 DIAGNOSIS — C44519 Basal cell carcinoma of skin of other part of trunk: Secondary | ICD-10-CM | POA: Diagnosis not present

## 2016-06-18 DIAGNOSIS — C44509 Unspecified malignant neoplasm of skin of other part of trunk: Secondary | ICD-10-CM | POA: Diagnosis not present

## 2016-07-10 DIAGNOSIS — R3911 Hesitancy of micturition: Secondary | ICD-10-CM | POA: Diagnosis not present

## 2016-07-10 DIAGNOSIS — R972 Elevated prostate specific antigen [PSA]: Secondary | ICD-10-CM | POA: Diagnosis not present

## 2016-07-10 DIAGNOSIS — N5201 Erectile dysfunction due to arterial insufficiency: Secondary | ICD-10-CM | POA: Diagnosis not present

## 2016-07-10 DIAGNOSIS — N401 Enlarged prostate with lower urinary tract symptoms: Secondary | ICD-10-CM | POA: Diagnosis not present

## 2016-07-25 DIAGNOSIS — Z0000 Encounter for general adult medical examination without abnormal findings: Secondary | ICD-10-CM | POA: Diagnosis not present

## 2016-07-25 DIAGNOSIS — I10 Essential (primary) hypertension: Secondary | ICD-10-CM | POA: Diagnosis not present

## 2016-07-25 DIAGNOSIS — Z125 Encounter for screening for malignant neoplasm of prostate: Secondary | ICD-10-CM | POA: Diagnosis not present

## 2016-07-30 DIAGNOSIS — N5201 Erectile dysfunction due to arterial insufficiency: Secondary | ICD-10-CM | POA: Diagnosis not present

## 2016-07-30 DIAGNOSIS — R5383 Other fatigue: Secondary | ICD-10-CM | POA: Diagnosis not present

## 2016-07-30 DIAGNOSIS — Z833 Family history of diabetes mellitus: Secondary | ICD-10-CM | POA: Diagnosis not present

## 2016-07-30 DIAGNOSIS — D72819 Decreased white blood cell count, unspecified: Secondary | ICD-10-CM | POA: Diagnosis not present

## 2016-08-07 DIAGNOSIS — Z1211 Encounter for screening for malignant neoplasm of colon: Secondary | ICD-10-CM | POA: Diagnosis not present

## 2016-08-07 DIAGNOSIS — Z1212 Encounter for screening for malignant neoplasm of rectum: Secondary | ICD-10-CM | POA: Diagnosis not present

## 2016-09-19 DIAGNOSIS — I10 Essential (primary) hypertension: Secondary | ICD-10-CM | POA: Diagnosis not present

## 2016-09-19 DIAGNOSIS — Z7901 Long term (current) use of anticoagulants: Secondary | ICD-10-CM | POA: Diagnosis not present

## 2016-09-19 DIAGNOSIS — I480 Paroxysmal atrial fibrillation: Secondary | ICD-10-CM | POA: Diagnosis not present

## 2016-10-14 DIAGNOSIS — Z8249 Family history of ischemic heart disease and other diseases of the circulatory system: Secondary | ICD-10-CM | POA: Diagnosis not present

## 2016-10-14 DIAGNOSIS — I480 Paroxysmal atrial fibrillation: Secondary | ICD-10-CM | POA: Diagnosis not present

## 2016-10-14 DIAGNOSIS — Z7901 Long term (current) use of anticoagulants: Secondary | ICD-10-CM | POA: Diagnosis not present

## 2016-10-14 DIAGNOSIS — I10 Essential (primary) hypertension: Secondary | ICD-10-CM | POA: Diagnosis not present

## 2016-11-11 DIAGNOSIS — R5383 Other fatigue: Secondary | ICD-10-CM | POA: Diagnosis not present

## 2016-11-11 DIAGNOSIS — D61818 Other pancytopenia: Secondary | ICD-10-CM | POA: Diagnosis not present

## 2016-11-11 DIAGNOSIS — Z0289 Encounter for other administrative examinations: Secondary | ICD-10-CM | POA: Diagnosis not present

## 2016-11-11 DIAGNOSIS — Z5181 Encounter for therapeutic drug level monitoring: Secondary | ICD-10-CM | POA: Diagnosis not present

## 2016-11-11 DIAGNOSIS — F5104 Psychophysiologic insomnia: Secondary | ICD-10-CM | POA: Diagnosis not present

## 2016-11-13 DIAGNOSIS — S86311A Strain of muscle(s) and tendon(s) of peroneal muscle group at lower leg level, right leg, initial encounter: Secondary | ICD-10-CM | POA: Diagnosis not present

## 2016-11-13 DIAGNOSIS — G8929 Other chronic pain: Secondary | ICD-10-CM | POA: Diagnosis not present

## 2016-11-13 DIAGNOSIS — Y33XXXA Other specified events, undetermined intent, initial encounter: Secondary | ICD-10-CM | POA: Diagnosis not present

## 2016-11-13 DIAGNOSIS — Z96661 Presence of right artificial ankle joint: Secondary | ICD-10-CM | POA: Diagnosis not present

## 2016-11-13 DIAGNOSIS — M25571 Pain in right ankle and joints of right foot: Secondary | ICD-10-CM | POA: Diagnosis not present

## 2016-12-26 ENCOUNTER — Other Ambulatory Visit: Payer: Self-pay | Admitting: Cardiology

## 2016-12-26 DIAGNOSIS — Z8249 Family history of ischemic heart disease and other diseases of the circulatory system: Secondary | ICD-10-CM

## 2017-01-15 ENCOUNTER — Ambulatory Visit
Admission: RE | Admit: 2017-01-15 | Discharge: 2017-01-15 | Disposition: A | Discharge status: Home / Self Care | Payer: Medicare Other | Source: Ambulatory Visit | Attending: Cardiology | Admitting: Cardiology

## 2017-01-15 DIAGNOSIS — I10 Essential (primary) hypertension: Secondary | ICD-10-CM | POA: Diagnosis not present

## 2017-01-15 DIAGNOSIS — Z8249 Family history of ischemic heart disease and other diseases of the circulatory system: Secondary | ICD-10-CM

## 2017-01-15 IMAGING — US US AORTA
1 series · 9 of 9 positions shown · non-contrast
Comparison: None.

CLINICAL DATA: Hypertension

EXAM:
ULTRASOUND OF ABDOMINAL AORTA
TECHNIQUE: Ultrasound examination of the abdominal aorta was performed to
evaluate for abdominal aortic aneurysm.

[Series 1: us aorta · 0.25mm/px · 9 of 9 slices shown]
[im 1/9]
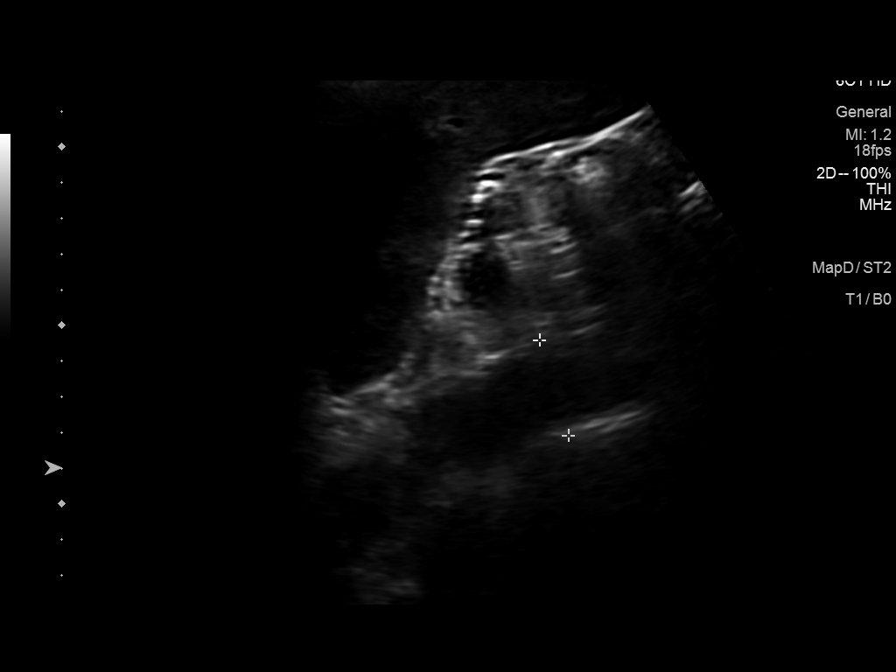
[im 2/9]
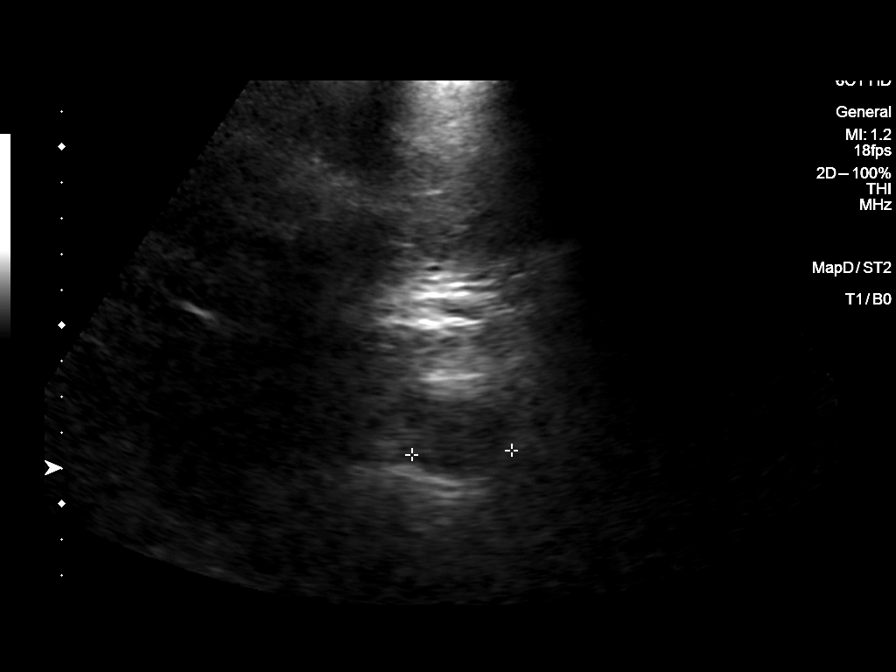
[im 3/9]
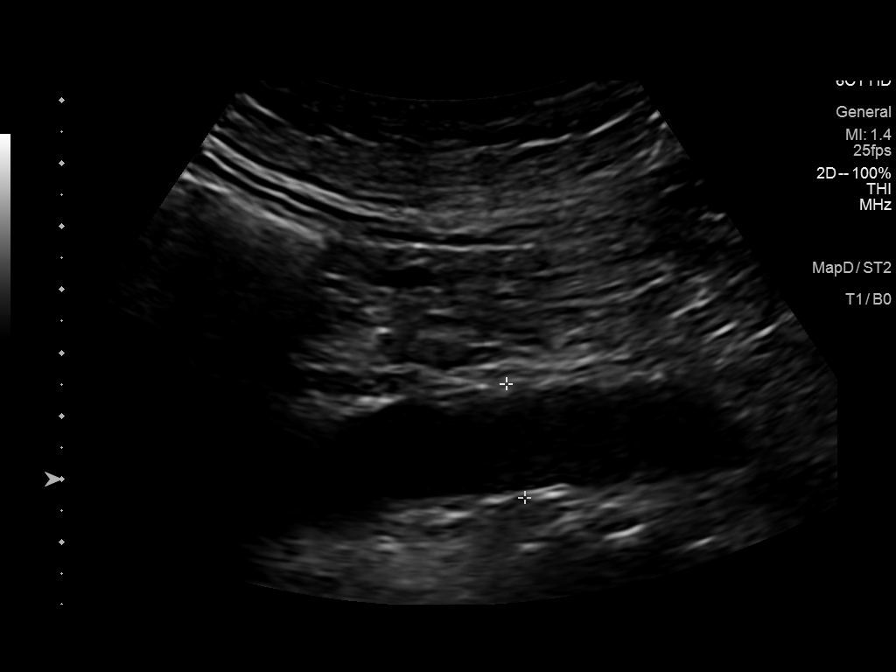
[im 4/9]
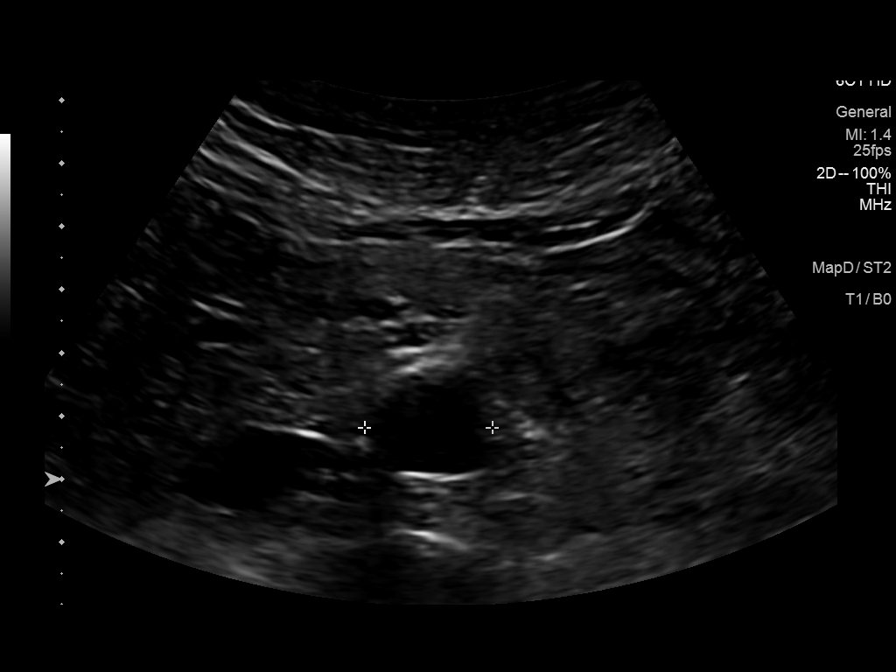
[im 5/9]
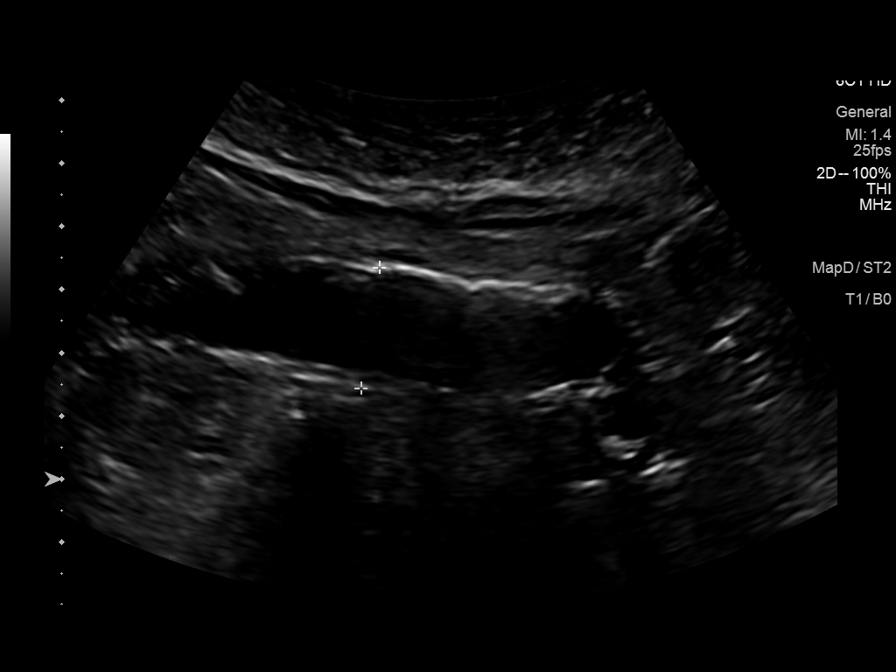
[im 6/9]
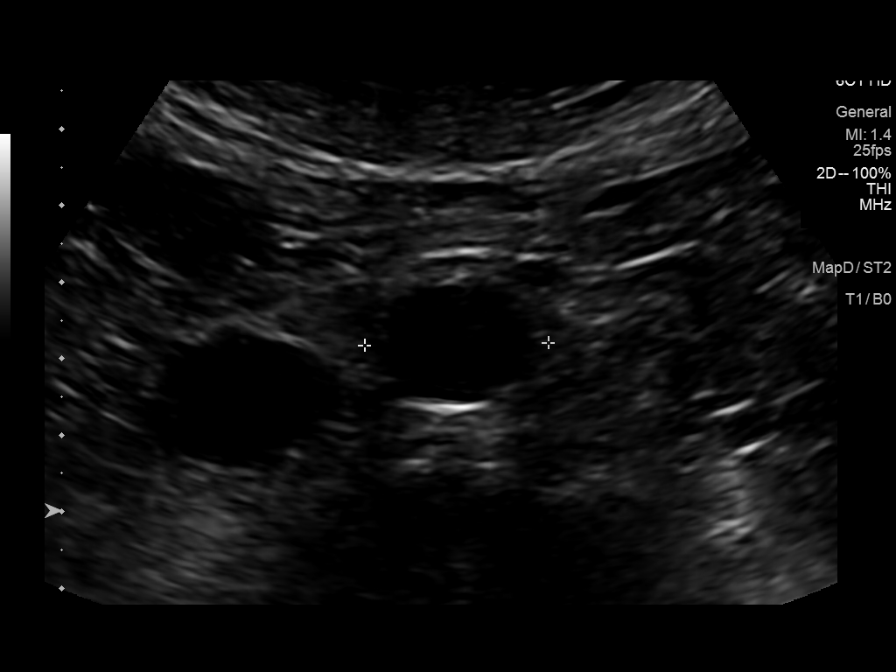
[im 7/9]
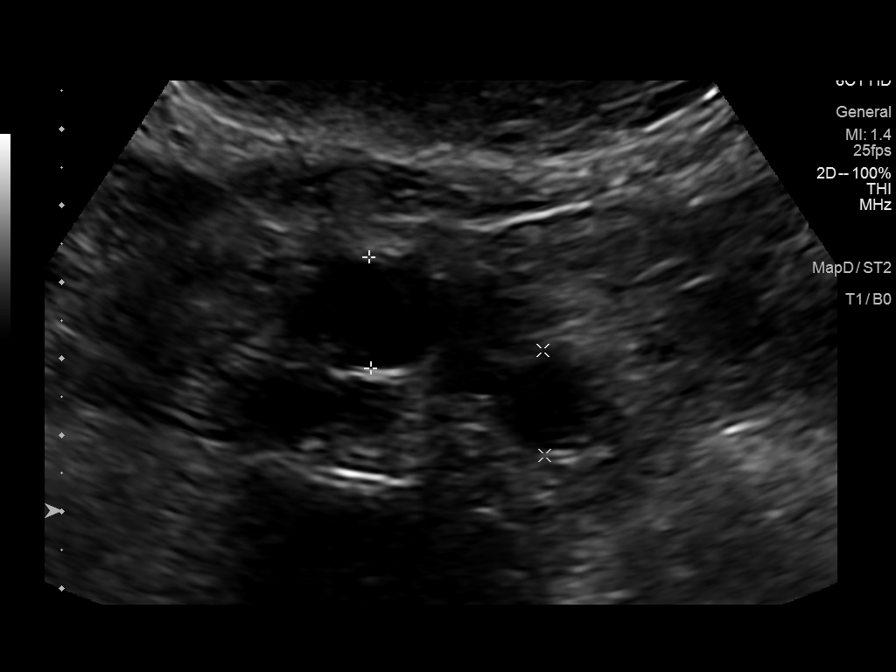
[im 8/9]
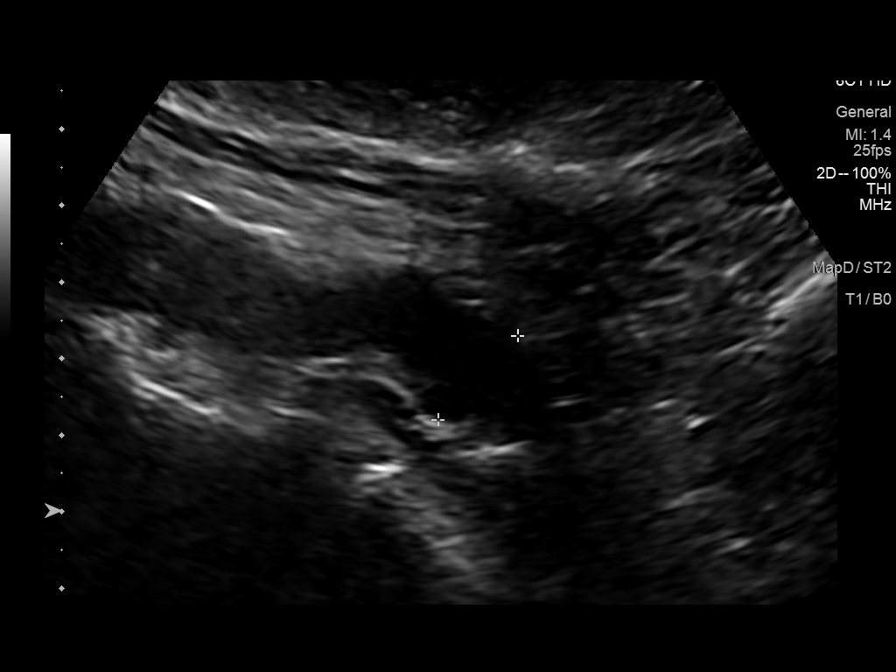
[im 9/9]
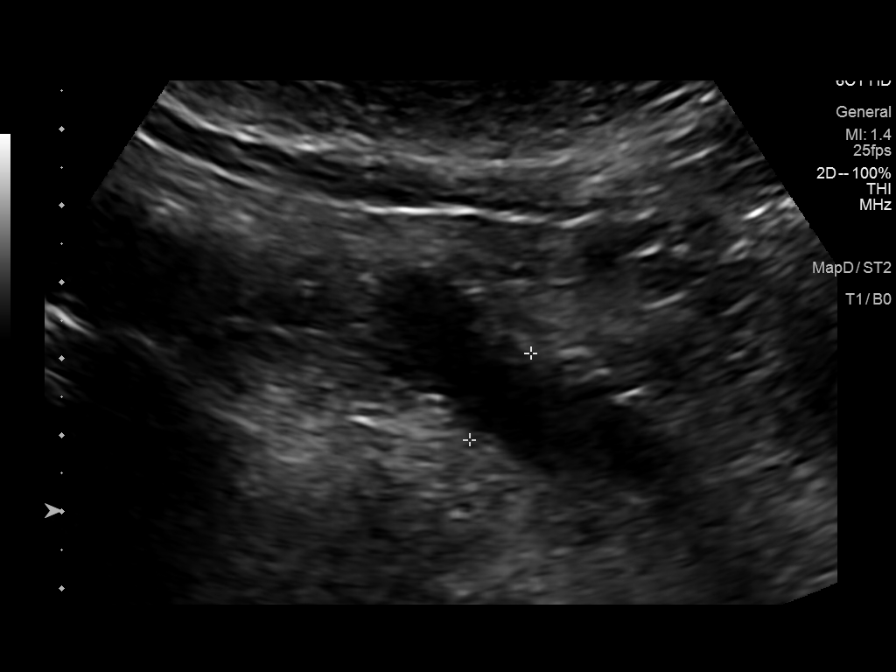

[9 of 9 positions shown; findings below may reference images not displayed]

FINDINGS: Abdominal aortic measurements as follows:

Proximal:  2.7 x 2.8  cm

Mid:  1.8 x 2.0 cm

Distal:  1.9 x 2.4 cm

Proximal common iliac artery show no aneurysm. There is no
periaortic fluid or adenopathy.
IMPRESSION: Maximum measured transverse diameter is 2.7 x 2.8 cm. Ectatic
abdominal aorta at risk for aneurysm development. Recommend followup
by ultrasound in 5 years. This recommendation follows ACR consensus
guidelines: White Paper of the ACR Incidental Findings Committee II
on Vascular Findings. [HOSPITAL] [B4]; [DATE].

Study otherwise unremarkable.

## 2017-01-30 DIAGNOSIS — D61818 Other pancytopenia: Secondary | ICD-10-CM | POA: Diagnosis not present

## 2017-02-03 DIAGNOSIS — D72819 Decreased white blood cell count, unspecified: Secondary | ICD-10-CM | POA: Diagnosis not present

## 2017-02-03 DIAGNOSIS — F419 Anxiety disorder, unspecified: Secondary | ICD-10-CM | POA: Diagnosis not present

## 2017-02-03 DIAGNOSIS — I10 Essential (primary) hypertension: Secondary | ICD-10-CM | POA: Diagnosis not present

## 2017-02-03 DIAGNOSIS — F5104 Psychophysiologic insomnia: Secondary | ICD-10-CM | POA: Diagnosis not present

## 2017-02-03 DIAGNOSIS — G629 Polyneuropathy, unspecified: Secondary | ICD-10-CM | POA: Diagnosis not present

## 2017-02-03 DIAGNOSIS — R5383 Other fatigue: Secondary | ICD-10-CM | POA: Diagnosis not present

## 2017-04-15 DIAGNOSIS — I480 Paroxysmal atrial fibrillation: Secondary | ICD-10-CM | POA: Diagnosis not present

## 2017-05-20 DIAGNOSIS — I480 Paroxysmal atrial fibrillation: Secondary | ICD-10-CM | POA: Diagnosis not present

## 2017-05-20 DIAGNOSIS — I119 Hypertensive heart disease without heart failure: Secondary | ICD-10-CM | POA: Diagnosis not present

## 2017-05-20 DIAGNOSIS — Z7901 Long term (current) use of anticoagulants: Secondary | ICD-10-CM | POA: Diagnosis not present

## 2017-05-20 DIAGNOSIS — Z8249 Family history of ischemic heart disease and other diseases of the circulatory system: Secondary | ICD-10-CM | POA: Diagnosis not present

## 2017-07-01 DIAGNOSIS — Z8249 Family history of ischemic heart disease and other diseases of the circulatory system: Secondary | ICD-10-CM | POA: Diagnosis not present

## 2017-07-01 DIAGNOSIS — I480 Paroxysmal atrial fibrillation: Secondary | ICD-10-CM | POA: Diagnosis not present

## 2017-07-01 DIAGNOSIS — I119 Hypertensive heart disease without heart failure: Secondary | ICD-10-CM | POA: Diagnosis not present

## 2017-07-01 DIAGNOSIS — Z7901 Long term (current) use of anticoagulants: Secondary | ICD-10-CM | POA: Diagnosis not present

## 2017-07-09 ENCOUNTER — Other Ambulatory Visit: Payer: Self-pay | Admitting: Cardiovascular Disease

## 2017-07-15 DIAGNOSIS — N5201 Erectile dysfunction due to arterial insufficiency: Secondary | ICD-10-CM | POA: Diagnosis not present

## 2017-07-15 DIAGNOSIS — N401 Enlarged prostate with lower urinary tract symptoms: Secondary | ICD-10-CM | POA: Diagnosis not present

## 2017-07-15 DIAGNOSIS — R3911 Hesitancy of micturition: Secondary | ICD-10-CM | POA: Diagnosis not present

## 2017-07-24 DIAGNOSIS — G629 Polyneuropathy, unspecified: Secondary | ICD-10-CM | POA: Diagnosis not present

## 2017-07-24 DIAGNOSIS — E559 Vitamin D deficiency, unspecified: Secondary | ICD-10-CM | POA: Diagnosis not present

## 2017-07-24 DIAGNOSIS — R531 Weakness: Secondary | ICD-10-CM | POA: Diagnosis not present

## 2017-07-24 DIAGNOSIS — E538 Deficiency of other specified B group vitamins: Secondary | ICD-10-CM | POA: Diagnosis not present

## 2017-07-24 DIAGNOSIS — Z87891 Personal history of nicotine dependence: Secondary | ICD-10-CM | POA: Diagnosis not present

## 2017-08-01 DIAGNOSIS — I10 Essential (primary) hypertension: Secondary | ICD-10-CM | POA: Diagnosis not present

## 2017-08-01 DIAGNOSIS — D510 Vitamin B12 deficiency anemia due to intrinsic factor deficiency: Secondary | ICD-10-CM | POA: Diagnosis not present

## 2017-08-01 DIAGNOSIS — Z125 Encounter for screening for malignant neoplasm of prostate: Secondary | ICD-10-CM | POA: Diagnosis not present

## 2017-08-04 DIAGNOSIS — Z0000 Encounter for general adult medical examination without abnormal findings: Secondary | ICD-10-CM | POA: Diagnosis not present

## 2017-08-06 DIAGNOSIS — I493 Ventricular premature depolarization: Secondary | ICD-10-CM | POA: Diagnosis not present

## 2017-08-06 DIAGNOSIS — N62 Hypertrophy of breast: Secondary | ICD-10-CM | POA: Diagnosis not present

## 2017-08-06 DIAGNOSIS — D72819 Decreased white blood cell count, unspecified: Secondary | ICD-10-CM | POA: Diagnosis not present

## 2017-08-06 DIAGNOSIS — D61818 Other pancytopenia: Secondary | ICD-10-CM | POA: Diagnosis not present

## 2017-08-06 DIAGNOSIS — N401 Enlarged prostate with lower urinary tract symptoms: Secondary | ICD-10-CM | POA: Diagnosis not present

## 2017-08-06 DIAGNOSIS — D510 Vitamin B12 deficiency anemia due to intrinsic factor deficiency: Secondary | ICD-10-CM | POA: Diagnosis not present

## 2017-08-06 DIAGNOSIS — I10 Essential (primary) hypertension: Secondary | ICD-10-CM | POA: Diagnosis not present

## 2017-08-06 DIAGNOSIS — R5383 Other fatigue: Secondary | ICD-10-CM | POA: Diagnosis not present

## 2017-08-06 DIAGNOSIS — R002 Palpitations: Secondary | ICD-10-CM | POA: Diagnosis not present

## 2017-08-06 DIAGNOSIS — Z0289 Encounter for other administrative examinations: Secondary | ICD-10-CM | POA: Diagnosis not present

## 2017-08-06 DIAGNOSIS — I480 Paroxysmal atrial fibrillation: Secondary | ICD-10-CM | POA: Diagnosis not present

## 2017-08-06 DIAGNOSIS — E876 Hypokalemia: Secondary | ICD-10-CM | POA: Diagnosis not present

## 2017-09-09 DIAGNOSIS — L578 Other skin changes due to chronic exposure to nonionizing radiation: Secondary | ICD-10-CM | POA: Diagnosis not present

## 2017-09-19 DIAGNOSIS — I119 Hypertensive heart disease without heart failure: Secondary | ICD-10-CM | POA: Diagnosis not present

## 2017-09-19 DIAGNOSIS — I480 Paroxysmal atrial fibrillation: Secondary | ICD-10-CM | POA: Diagnosis not present

## 2017-09-19 DIAGNOSIS — Z7901 Long term (current) use of anticoagulants: Secondary | ICD-10-CM | POA: Diagnosis not present

## 2017-09-19 DIAGNOSIS — Z8249 Family history of ischemic heart disease and other diseases of the circulatory system: Secondary | ICD-10-CM | POA: Diagnosis not present

## 2017-09-19 DIAGNOSIS — E7849 Other hyperlipidemia: Secondary | ICD-10-CM | POA: Diagnosis not present

## 2017-10-08 DIAGNOSIS — G4719 Other hypersomnia: Secondary | ICD-10-CM | POA: Diagnosis not present

## 2017-10-09 DIAGNOSIS — I480 Paroxysmal atrial fibrillation: Secondary | ICD-10-CM | POA: Diagnosis not present

## 2017-10-09 DIAGNOSIS — I471 Supraventricular tachycardia: Secondary | ICD-10-CM | POA: Diagnosis not present

## 2017-10-24 DIAGNOSIS — Z7901 Long term (current) use of anticoagulants: Secondary | ICD-10-CM | POA: Diagnosis not present

## 2017-10-24 DIAGNOSIS — R918 Other nonspecific abnormal finding of lung field: Secondary | ICD-10-CM | POA: Diagnosis not present

## 2017-10-24 DIAGNOSIS — I471 Supraventricular tachycardia: Secondary | ICD-10-CM | POA: Diagnosis not present

## 2017-10-24 DIAGNOSIS — I480 Paroxysmal atrial fibrillation: Secondary | ICD-10-CM | POA: Diagnosis not present

## 2017-10-24 DIAGNOSIS — Z9889 Other specified postprocedural states: Secondary | ICD-10-CM | POA: Diagnosis not present

## 2017-10-24 DIAGNOSIS — I10 Essential (primary) hypertension: Secondary | ICD-10-CM | POA: Diagnosis not present

## 2017-10-24 DIAGNOSIS — I4891 Unspecified atrial fibrillation: Secondary | ICD-10-CM | POA: Diagnosis not present

## 2017-10-24 DIAGNOSIS — R339 Retention of urine, unspecified: Secondary | ICD-10-CM | POA: Diagnosis not present

## 2017-10-27 DIAGNOSIS — N183 Chronic kidney disease, stage 3 (moderate): Secondary | ICD-10-CM | POA: Diagnosis not present

## 2017-10-27 DIAGNOSIS — I129 Hypertensive chronic kidney disease with stage 1 through stage 4 chronic kidney disease, or unspecified chronic kidney disease: Secondary | ICD-10-CM | POA: Diagnosis not present

## 2017-10-27 DIAGNOSIS — I471 Supraventricular tachycardia: Secondary | ICD-10-CM | POA: Diagnosis not present

## 2017-10-27 DIAGNOSIS — I481 Persistent atrial fibrillation: Secondary | ICD-10-CM | POA: Diagnosis not present

## 2017-10-28 DIAGNOSIS — Z8679 Personal history of other diseases of the circulatory system: Secondary | ICD-10-CM | POA: Diagnosis not present

## 2017-10-28 DIAGNOSIS — R931 Abnormal findings on diagnostic imaging of heart and coronary circulation: Secondary | ICD-10-CM | POA: Diagnosis not present

## 2017-10-28 DIAGNOSIS — I493 Ventricular premature depolarization: Secondary | ICD-10-CM | POA: Diagnosis not present

## 2017-10-28 DIAGNOSIS — N401 Enlarged prostate with lower urinary tract symptoms: Secondary | ICD-10-CM | POA: Diagnosis present

## 2017-10-28 DIAGNOSIS — I517 Cardiomegaly: Secondary | ICD-10-CM | POA: Diagnosis not present

## 2017-10-28 DIAGNOSIS — Z87891 Personal history of nicotine dependence: Secondary | ICD-10-CM | POA: Diagnosis not present

## 2017-10-28 DIAGNOSIS — R0902 Hypoxemia: Secondary | ICD-10-CM | POA: Diagnosis not present

## 2017-10-28 DIAGNOSIS — I371 Nonrheumatic pulmonary valve insufficiency: Secondary | ICD-10-CM | POA: Diagnosis not present

## 2017-10-28 DIAGNOSIS — R509 Fever, unspecified: Secondary | ICD-10-CM | POA: Diagnosis not present

## 2017-10-28 DIAGNOSIS — I481 Persistent atrial fibrillation: Secondary | ICD-10-CM | POA: Diagnosis present

## 2017-10-28 DIAGNOSIS — I10 Essential (primary) hypertension: Secondary | ICD-10-CM | POA: Diagnosis not present

## 2017-10-28 DIAGNOSIS — R918 Other nonspecific abnormal finding of lung field: Secondary | ICD-10-CM | POA: Diagnosis not present

## 2017-10-28 DIAGNOSIS — G629 Polyneuropathy, unspecified: Secondary | ICD-10-CM | POA: Diagnosis present

## 2017-10-28 DIAGNOSIS — I471 Supraventricular tachycardia: Secondary | ICD-10-CM | POA: Diagnosis not present

## 2017-10-28 DIAGNOSIS — I480 Paroxysmal atrial fibrillation: Secondary | ICD-10-CM | POA: Diagnosis not present

## 2017-10-28 DIAGNOSIS — I129 Hypertensive chronic kidney disease with stage 1 through stage 4 chronic kidney disease, or unspecified chronic kidney disease: Secondary | ICD-10-CM | POA: Diagnosis present

## 2017-10-28 DIAGNOSIS — Z9889 Other specified postprocedural states: Secondary | ICD-10-CM | POA: Diagnosis not present

## 2017-10-28 DIAGNOSIS — Z79899 Other long term (current) drug therapy: Secondary | ICD-10-CM | POA: Diagnosis not present

## 2017-10-28 DIAGNOSIS — I4892 Unspecified atrial flutter: Secondary | ICD-10-CM | POA: Diagnosis present

## 2017-10-28 DIAGNOSIS — R338 Other retention of urine: Secondary | ICD-10-CM | POA: Diagnosis present

## 2017-10-28 DIAGNOSIS — N183 Chronic kidney disease, stage 3 (moderate): Secondary | ICD-10-CM | POA: Diagnosis not present

## 2017-10-28 DIAGNOSIS — I491 Atrial premature depolarization: Secondary | ICD-10-CM | POA: Diagnosis not present

## 2017-11-04 DIAGNOSIS — I119 Hypertensive heart disease without heart failure: Secondary | ICD-10-CM | POA: Diagnosis not present

## 2017-11-04 DIAGNOSIS — E7849 Other hyperlipidemia: Secondary | ICD-10-CM | POA: Diagnosis not present

## 2017-11-04 DIAGNOSIS — Z8249 Family history of ischemic heart disease and other diseases of the circulatory system: Secondary | ICD-10-CM | POA: Diagnosis not present

## 2017-11-04 DIAGNOSIS — Z7901 Long term (current) use of anticoagulants: Secondary | ICD-10-CM | POA: Diagnosis not present

## 2017-11-04 DIAGNOSIS — I480 Paroxysmal atrial fibrillation: Secondary | ICD-10-CM | POA: Diagnosis not present

## 2017-11-10 DIAGNOSIS — G4710 Hypersomnia, unspecified: Secondary | ICD-10-CM | POA: Diagnosis not present

## 2017-11-10 DIAGNOSIS — G4719 Other hypersomnia: Secondary | ICD-10-CM | POA: Diagnosis not present

## 2017-11-17 DIAGNOSIS — I480 Paroxysmal atrial fibrillation: Secondary | ICD-10-CM | POA: Diagnosis not present

## 2017-11-24 DIAGNOSIS — I4892 Unspecified atrial flutter: Secondary | ICD-10-CM | POA: Diagnosis not present

## 2017-12-03 DIAGNOSIS — Z9889 Other specified postprocedural states: Secondary | ICD-10-CM | POA: Diagnosis not present

## 2017-12-03 DIAGNOSIS — Z79899 Other long term (current) drug therapy: Secondary | ICD-10-CM | POA: Diagnosis not present

## 2017-12-03 DIAGNOSIS — R809 Proteinuria, unspecified: Secondary | ICD-10-CM | POA: Diagnosis not present

## 2017-12-03 DIAGNOSIS — N179 Acute kidney failure, unspecified: Secondary | ICD-10-CM | POA: Diagnosis not present

## 2017-12-03 DIAGNOSIS — R7989 Other specified abnormal findings of blood chemistry: Secondary | ICD-10-CM | POA: Diagnosis not present

## 2017-12-03 DIAGNOSIS — I493 Ventricular premature depolarization: Secondary | ICD-10-CM | POA: Diagnosis not present

## 2017-12-03 DIAGNOSIS — N189 Chronic kidney disease, unspecified: Secondary | ICD-10-CM | POA: Diagnosis not present

## 2017-12-03 DIAGNOSIS — I4892 Unspecified atrial flutter: Secondary | ICD-10-CM | POA: Diagnosis not present

## 2017-12-03 DIAGNOSIS — I440 Atrioventricular block, first degree: Secondary | ICD-10-CM | POA: Diagnosis not present

## 2017-12-03 DIAGNOSIS — I4581 Long QT syndrome: Secondary | ICD-10-CM | POA: Diagnosis not present

## 2017-12-03 DIAGNOSIS — I471 Supraventricular tachycardia: Secondary | ICD-10-CM | POA: Diagnosis not present

## 2017-12-03 DIAGNOSIS — Z87891 Personal history of nicotine dependence: Secondary | ICD-10-CM | POA: Diagnosis not present

## 2017-12-03 DIAGNOSIS — E876 Hypokalemia: Secondary | ICD-10-CM | POA: Diagnosis not present

## 2017-12-03 DIAGNOSIS — I4891 Unspecified atrial fibrillation: Secondary | ICD-10-CM | POA: Diagnosis not present

## 2017-12-03 DIAGNOSIS — I1310 Hypertensive heart and chronic kidney disease without heart failure, with stage 1 through stage 4 chronic kidney disease, or unspecified chronic kidney disease: Secondary | ICD-10-CM | POA: Diagnosis not present

## 2017-12-03 DIAGNOSIS — I10 Essential (primary) hypertension: Secondary | ICD-10-CM | POA: Diagnosis not present

## 2017-12-03 DIAGNOSIS — I480 Paroxysmal atrial fibrillation: Secondary | ICD-10-CM | POA: Diagnosis not present

## 2018-01-08 DIAGNOSIS — I480 Paroxysmal atrial fibrillation: Secondary | ICD-10-CM | POA: Diagnosis not present

## 2018-01-08 DIAGNOSIS — N1330 Unspecified hydronephrosis: Secondary | ICD-10-CM | POA: Diagnosis not present

## 2018-01-08 DIAGNOSIS — I129 Hypertensive chronic kidney disease with stage 1 through stage 4 chronic kidney disease, or unspecified chronic kidney disease: Secondary | ICD-10-CM | POA: Diagnosis not present

## 2018-01-08 DIAGNOSIS — Z87891 Personal history of nicotine dependence: Secondary | ICD-10-CM | POA: Diagnosis not present

## 2018-01-08 DIAGNOSIS — N179 Acute kidney failure, unspecified: Secondary | ICD-10-CM | POA: Diagnosis not present

## 2018-01-08 DIAGNOSIS — G609 Hereditary and idiopathic neuropathy, unspecified: Secondary | ICD-10-CM | POA: Diagnosis not present

## 2018-01-08 DIAGNOSIS — I471 Supraventricular tachycardia: Secondary | ICD-10-CM | POA: Diagnosis not present

## 2018-01-08 DIAGNOSIS — E538 Deficiency of other specified B group vitamins: Secondary | ICD-10-CM | POA: Diagnosis not present

## 2018-01-08 DIAGNOSIS — N2889 Other specified disorders of kidney and ureter: Secondary | ICD-10-CM | POA: Diagnosis not present

## 2018-01-08 DIAGNOSIS — E876 Hypokalemia: Secondary | ICD-10-CM | POA: Diagnosis not present

## 2018-01-08 DIAGNOSIS — G6289 Other specified polyneuropathies: Secondary | ICD-10-CM | POA: Diagnosis not present

## 2018-01-08 DIAGNOSIS — E559 Vitamin D deficiency, unspecified: Secondary | ICD-10-CM | POA: Diagnosis not present

## 2018-01-08 DIAGNOSIS — N189 Chronic kidney disease, unspecified: Secondary | ICD-10-CM | POA: Diagnosis not present

## 2018-01-21 DIAGNOSIS — N2889 Other specified disorders of kidney and ureter: Secondary | ICD-10-CM | POA: Diagnosis not present

## 2018-01-30 DIAGNOSIS — N2889 Other specified disorders of kidney and ureter: Secondary | ICD-10-CM | POA: Diagnosis not present

## 2018-02-02 DIAGNOSIS — I10 Essential (primary) hypertension: Secondary | ICD-10-CM | POA: Diagnosis not present

## 2018-02-02 DIAGNOSIS — D72819 Decreased white blood cell count, unspecified: Secondary | ICD-10-CM | POA: Diagnosis not present

## 2018-02-03 DIAGNOSIS — Z7901 Long term (current) use of anticoagulants: Secondary | ICD-10-CM | POA: Diagnosis not present

## 2018-02-03 DIAGNOSIS — E7849 Other hyperlipidemia: Secondary | ICD-10-CM | POA: Diagnosis not present

## 2018-02-03 DIAGNOSIS — Z8249 Family history of ischemic heart disease and other diseases of the circulatory system: Secondary | ICD-10-CM | POA: Diagnosis not present

## 2018-02-03 DIAGNOSIS — I119 Hypertensive heart disease without heart failure: Secondary | ICD-10-CM | POA: Diagnosis not present

## 2018-02-03 DIAGNOSIS — I480 Paroxysmal atrial fibrillation: Secondary | ICD-10-CM | POA: Diagnosis not present

## 2018-02-05 DIAGNOSIS — D72819 Decreased white blood cell count, unspecified: Secondary | ICD-10-CM | POA: Diagnosis not present

## 2018-02-05 DIAGNOSIS — I10 Essential (primary) hypertension: Secondary | ICD-10-CM | POA: Diagnosis not present

## 2018-02-05 DIAGNOSIS — E876 Hypokalemia: Secondary | ICD-10-CM | POA: Diagnosis not present

## 2018-02-11 DIAGNOSIS — I10 Essential (primary) hypertension: Secondary | ICD-10-CM | POA: Diagnosis not present

## 2018-02-12 DIAGNOSIS — N2889 Other specified disorders of kidney and ureter: Secondary | ICD-10-CM | POA: Diagnosis not present

## 2018-02-16 DIAGNOSIS — I10 Essential (primary) hypertension: Secondary | ICD-10-CM | POA: Diagnosis not present

## 2018-02-17 DIAGNOSIS — I10 Essential (primary) hypertension: Secondary | ICD-10-CM | POA: Diagnosis not present

## 2018-02-17 DIAGNOSIS — C649 Malignant neoplasm of unspecified kidney, except renal pelvis: Secondary | ICD-10-CM | POA: Diagnosis not present

## 2018-03-02 DIAGNOSIS — N2889 Other specified disorders of kidney and ureter: Secondary | ICD-10-CM | POA: Diagnosis not present

## 2018-04-01 DIAGNOSIS — Z7289 Other problems related to lifestyle: Secondary | ICD-10-CM | POA: Diagnosis not present

## 2018-04-01 DIAGNOSIS — E876 Hypokalemia: Secondary | ICD-10-CM | POA: Diagnosis not present

## 2018-04-01 DIAGNOSIS — D6959 Other secondary thrombocytopenia: Secondary | ICD-10-CM | POA: Diagnosis not present

## 2018-04-01 DIAGNOSIS — D638 Anemia in other chronic diseases classified elsewhere: Secondary | ICD-10-CM | POA: Diagnosis not present

## 2018-04-30 DIAGNOSIS — Z23 Encounter for immunization: Secondary | ICD-10-CM | POA: Diagnosis not present

## 2018-06-02 DIAGNOSIS — N2889 Other specified disorders of kidney and ureter: Secondary | ICD-10-CM | POA: Diagnosis not present

## 2018-06-05 ENCOUNTER — Other Ambulatory Visit: Payer: Self-pay

## 2018-06-05 DIAGNOSIS — L905 Scar conditions and fibrosis of skin: Secondary | ICD-10-CM | POA: Diagnosis not present

## 2018-06-05 DIAGNOSIS — Z85828 Personal history of other malignant neoplasm of skin: Secondary | ICD-10-CM | POA: Diagnosis not present

## 2018-06-05 DIAGNOSIS — L570 Actinic keratosis: Secondary | ICD-10-CM | POA: Diagnosis not present

## 2018-06-05 DIAGNOSIS — C44219 Basal cell carcinoma of skin of left ear and external auricular canal: Secondary | ICD-10-CM | POA: Diagnosis not present

## 2018-06-05 DIAGNOSIS — D485 Neoplasm of uncertain behavior of skin: Secondary | ICD-10-CM | POA: Diagnosis not present

## 2018-06-10 DIAGNOSIS — N2889 Other specified disorders of kidney and ureter: Secondary | ICD-10-CM | POA: Diagnosis not present

## 2018-06-10 DIAGNOSIS — N182 Chronic kidney disease, stage 2 (mild): Secondary | ICD-10-CM | POA: Diagnosis not present

## 2018-06-10 DIAGNOSIS — R7989 Other specified abnormal findings of blood chemistry: Secondary | ICD-10-CM | POA: Diagnosis not present

## 2018-06-10 DIAGNOSIS — I129 Hypertensive chronic kidney disease with stage 1 through stage 4 chronic kidney disease, or unspecified chronic kidney disease: Secondary | ICD-10-CM | POA: Diagnosis not present

## 2018-06-10 DIAGNOSIS — Z8679 Personal history of other diseases of the circulatory system: Secondary | ICD-10-CM | POA: Diagnosis not present

## 2018-06-10 DIAGNOSIS — N189 Chronic kidney disease, unspecified: Secondary | ICD-10-CM | POA: Diagnosis not present

## 2018-06-10 DIAGNOSIS — I10 Essential (primary) hypertension: Secondary | ICD-10-CM | POA: Diagnosis not present

## 2018-06-10 DIAGNOSIS — Z79899 Other long term (current) drug therapy: Secondary | ICD-10-CM | POA: Diagnosis not present

## 2018-06-10 DIAGNOSIS — Z87891 Personal history of nicotine dependence: Secondary | ICD-10-CM | POA: Diagnosis not present

## 2018-06-10 DIAGNOSIS — N183 Chronic kidney disease, stage 3 (moderate): Secondary | ICD-10-CM | POA: Diagnosis not present

## 2018-06-10 DIAGNOSIS — Z9889 Other specified postprocedural states: Secondary | ICD-10-CM | POA: Diagnosis not present

## 2018-06-10 DIAGNOSIS — I480 Paroxysmal atrial fibrillation: Secondary | ICD-10-CM | POA: Diagnosis not present

## 2018-06-24 ENCOUNTER — Telehealth: Payer: Self-pay | Admitting: Cardiology

## 2018-06-24 DIAGNOSIS — C44622 Squamous cell carcinoma of skin of right upper limb, including shoulder: Secondary | ICD-10-CM | POA: Diagnosis not present

## 2018-07-01 NOTE — Telephone Encounter (Signed)
done

## 2018-07-28 DIAGNOSIS — Z23 Encounter for immunization: Secondary | ICD-10-CM | POA: Diagnosis not present

## 2018-07-28 DIAGNOSIS — L309 Dermatitis, unspecified: Secondary | ICD-10-CM | POA: Diagnosis not present

## 2018-07-30 DIAGNOSIS — C44219 Basal cell carcinoma of skin of left ear and external auricular canal: Secondary | ICD-10-CM | POA: Diagnosis not present

## 2018-08-05 ENCOUNTER — Other Ambulatory Visit: Payer: Self-pay | Admitting: Internal Medicine

## 2018-08-13 DIAGNOSIS — D72819 Decreased white blood cell count, unspecified: Secondary | ICD-10-CM | POA: Diagnosis not present

## 2018-08-13 DIAGNOSIS — I10 Essential (primary) hypertension: Secondary | ICD-10-CM | POA: Diagnosis not present

## 2018-08-13 DIAGNOSIS — Z125 Encounter for screening for malignant neoplasm of prostate: Secondary | ICD-10-CM | POA: Diagnosis not present

## 2018-08-17 DIAGNOSIS — N5201 Erectile dysfunction due to arterial insufficiency: Secondary | ICD-10-CM | POA: Diagnosis not present

## 2018-08-17 DIAGNOSIS — D696 Thrombocytopenia, unspecified: Secondary | ICD-10-CM | POA: Diagnosis not present

## 2018-08-17 DIAGNOSIS — H6121 Impacted cerumen, right ear: Secondary | ICD-10-CM | POA: Diagnosis not present

## 2018-08-17 DIAGNOSIS — I480 Paroxysmal atrial fibrillation: Secondary | ICD-10-CM | POA: Diagnosis not present

## 2018-08-17 DIAGNOSIS — N401 Enlarged prostate with lower urinary tract symptoms: Secondary | ICD-10-CM | POA: Diagnosis not present

## 2018-08-17 DIAGNOSIS — I440 Atrioventricular block, first degree: Secondary | ICD-10-CM | POA: Diagnosis not present

## 2018-08-17 DIAGNOSIS — Z0000 Encounter for general adult medical examination without abnormal findings: Secondary | ICD-10-CM | POA: Diagnosis not present

## 2018-08-17 DIAGNOSIS — F410 Panic disorder [episodic paroxysmal anxiety] without agoraphobia: Secondary | ICD-10-CM | POA: Diagnosis not present

## 2018-08-17 DIAGNOSIS — I10 Essential (primary) hypertension: Secondary | ICD-10-CM | POA: Diagnosis not present

## 2018-08-17 DIAGNOSIS — F329 Major depressive disorder, single episode, unspecified: Secondary | ICD-10-CM | POA: Diagnosis not present

## 2018-08-17 DIAGNOSIS — D708 Other neutropenia: Secondary | ICD-10-CM | POA: Diagnosis not present

## 2018-08-17 DIAGNOSIS — N2889 Other specified disorders of kidney and ureter: Secondary | ICD-10-CM | POA: Diagnosis not present

## 2018-08-21 ENCOUNTER — Other Ambulatory Visit: Payer: Self-pay | Admitting: Cardiology

## 2018-08-21 NOTE — Telephone Encounter (Signed)
Spoke with patient in November 2019 at which time he stated he did not want to see a [facility] cardiologist for F/U while Dr. Wynonia Lawman is on medical leave. Patient states he would schedule an appt with a cardiologist at [facility]. Confirmed that patient has done so last month. Medication will not be refilled by a [facility] provider.

## 2018-08-21 NOTE — Telephone Encounter (Signed)
[facility] record is currently down, will continue trying to access [facility] record  

## 2018-08-21 NOTE — Telephone Encounter (Signed)
° °  1. Which medications need to be refilled? (please list name of each medication and dose if known) Metoprolol succ er 25mg  tablets  2. Which pharmacy/location (including street and city if local pharmacy) is medication to be sent to?[facility]  3. Do they need a 30 day or 90 day supply? [place]

## 2018-09-21 DIAGNOSIS — I10 Essential (primary) hypertension: Secondary | ICD-10-CM | POA: Diagnosis not present

## 2018-10-30 DIAGNOSIS — D1801 Hemangioma of skin and subcutaneous tissue: Secondary | ICD-10-CM | POA: Diagnosis not present

## 2018-10-30 DIAGNOSIS — L578 Other skin changes due to chronic exposure to nonionizing radiation: Secondary | ICD-10-CM | POA: Diagnosis not present

## 2018-10-30 DIAGNOSIS — L819 Disorder of pigmentation, unspecified: Secondary | ICD-10-CM | POA: Diagnosis not present

## 2018-10-30 DIAGNOSIS — Z85828 Personal history of other malignant neoplasm of skin: Secondary | ICD-10-CM | POA: Diagnosis not present

## 2018-10-30 DIAGNOSIS — L821 Other seborrheic keratosis: Secondary | ICD-10-CM | POA: Diagnosis not present

## 2018-10-30 DIAGNOSIS — L814 Other melanin hyperpigmentation: Secondary | ICD-10-CM | POA: Diagnosis not present

## 2018-10-30 DIAGNOSIS — D229 Melanocytic nevi, unspecified: Secondary | ICD-10-CM | POA: Diagnosis not present

## 2018-10-30 DIAGNOSIS — L570 Actinic keratosis: Secondary | ICD-10-CM | POA: Diagnosis not present

## 2018-12-07 DIAGNOSIS — I10 Essential (primary) hypertension: Secondary | ICD-10-CM | POA: Diagnosis not present

## 2018-12-07 DIAGNOSIS — R0602 Shortness of breath: Secondary | ICD-10-CM | POA: Diagnosis not present

## 2018-12-28 DIAGNOSIS — I10 Essential (primary) hypertension: Secondary | ICD-10-CM | POA: Diagnosis not present

## 2018-12-28 DIAGNOSIS — F329 Major depressive disorder, single episode, unspecified: Secondary | ICD-10-CM | POA: Diagnosis not present

## 2018-12-28 DIAGNOSIS — I480 Paroxysmal atrial fibrillation: Secondary | ICD-10-CM | POA: Diagnosis not present

## 2019-01-20 DIAGNOSIS — L819 Disorder of pigmentation, unspecified: Secondary | ICD-10-CM | POA: Diagnosis not present

## 2019-01-20 DIAGNOSIS — L570 Actinic keratosis: Secondary | ICD-10-CM | POA: Diagnosis not present

## 2019-01-28 DIAGNOSIS — I480 Paroxysmal atrial fibrillation: Secondary | ICD-10-CM | POA: Diagnosis not present

## 2019-01-29 DIAGNOSIS — N2889 Other specified disorders of kidney and ureter: Secondary | ICD-10-CM | POA: Diagnosis not present

## 2019-01-29 DIAGNOSIS — R918 Other nonspecific abnormal finding of lung field: Secondary | ICD-10-CM | POA: Diagnosis not present

## 2019-02-03 DIAGNOSIS — N401 Enlarged prostate with lower urinary tract symptoms: Secondary | ICD-10-CM | POA: Diagnosis not present

## 2019-02-03 DIAGNOSIS — N2889 Other specified disorders of kidney and ureter: Secondary | ICD-10-CM | POA: Diagnosis not present

## 2019-02-25 DIAGNOSIS — H26493 Other secondary cataract, bilateral: Secondary | ICD-10-CM | POA: Diagnosis not present

## 2019-02-25 DIAGNOSIS — H5212 Myopia, left eye: Secondary | ICD-10-CM | POA: Diagnosis not present

## 2019-03-04 DIAGNOSIS — H26493 Other secondary cataract, bilateral: Secondary | ICD-10-CM | POA: Diagnosis not present

## 2019-03-23 DIAGNOSIS — H26491 Other secondary cataract, right eye: Secondary | ICD-10-CM | POA: Diagnosis not present

## 2019-04-21 DIAGNOSIS — Z23 Encounter for immunization: Secondary | ICD-10-CM | POA: Diagnosis not present

## 2019-04-22 DIAGNOSIS — D485 Neoplasm of uncertain behavior of skin: Secondary | ICD-10-CM | POA: Diagnosis not present

## 2019-04-22 DIAGNOSIS — L570 Actinic keratosis: Secondary | ICD-10-CM | POA: Diagnosis not present

## 2019-04-22 DIAGNOSIS — L578 Other skin changes due to chronic exposure to nonionizing radiation: Secondary | ICD-10-CM | POA: Diagnosis not present

## 2019-04-22 DIAGNOSIS — C4441 Basal cell carcinoma of skin of scalp and neck: Secondary | ICD-10-CM | POA: Diagnosis not present

## 2019-05-13 DIAGNOSIS — L570 Actinic keratosis: Secondary | ICD-10-CM | POA: Diagnosis not present

## 2019-05-13 DIAGNOSIS — L819 Disorder of pigmentation, unspecified: Secondary | ICD-10-CM | POA: Diagnosis not present

## 2019-06-17 DIAGNOSIS — C4441 Basal cell carcinoma of skin of scalp and neck: Secondary | ICD-10-CM | POA: Diagnosis not present

## 2019-08-23 DIAGNOSIS — Z125 Encounter for screening for malignant neoplasm of prostate: Secondary | ICD-10-CM | POA: Diagnosis not present

## 2019-08-23 DIAGNOSIS — I10 Essential (primary) hypertension: Secondary | ICD-10-CM | POA: Diagnosis not present

## 2019-08-25 DIAGNOSIS — Z9889 Other specified postprocedural states: Secondary | ICD-10-CM | POA: Diagnosis not present

## 2019-08-25 DIAGNOSIS — I4891 Unspecified atrial fibrillation: Secondary | ICD-10-CM | POA: Diagnosis not present

## 2019-08-25 DIAGNOSIS — I10 Essential (primary) hypertension: Secondary | ICD-10-CM | POA: Diagnosis not present

## 2019-08-25 DIAGNOSIS — Z0000 Encounter for general adult medical examination without abnormal findings: Secondary | ICD-10-CM | POA: Diagnosis not present

## 2019-08-25 DIAGNOSIS — N5201 Erectile dysfunction due to arterial insufficiency: Secondary | ICD-10-CM | POA: Diagnosis not present

## 2019-08-25 DIAGNOSIS — F329 Major depressive disorder, single episode, unspecified: Secondary | ICD-10-CM | POA: Diagnosis not present

## 2019-08-25 DIAGNOSIS — D696 Thrombocytopenia, unspecified: Secondary | ICD-10-CM | POA: Diagnosis not present

## 2019-08-25 DIAGNOSIS — D6869 Other thrombophilia: Secondary | ICD-10-CM | POA: Diagnosis not present

## 2019-08-25 DIAGNOSIS — F410 Panic disorder [episodic paroxysmal anxiety] without agoraphobia: Secondary | ICD-10-CM | POA: Diagnosis not present

## 2019-08-25 DIAGNOSIS — N401 Enlarged prostate with lower urinary tract symptoms: Secondary | ICD-10-CM | POA: Diagnosis not present

## 2019-08-25 DIAGNOSIS — I480 Paroxysmal atrial fibrillation: Secondary | ICD-10-CM | POA: Diagnosis not present

## 2019-09-13 DIAGNOSIS — L821 Other seborrheic keratosis: Secondary | ICD-10-CM | POA: Diagnosis not present

## 2019-09-13 DIAGNOSIS — L819 Disorder of pigmentation, unspecified: Secondary | ICD-10-CM | POA: Diagnosis not present

## 2019-09-13 DIAGNOSIS — L570 Actinic keratosis: Secondary | ICD-10-CM | POA: Diagnosis not present

## 2019-09-16 DIAGNOSIS — I480 Paroxysmal atrial fibrillation: Secondary | ICD-10-CM | POA: Diagnosis not present

## 2019-11-10 DIAGNOSIS — I10 Essential (primary) hypertension: Secondary | ICD-10-CM | POA: Diagnosis not present

## 2020-01-04 DIAGNOSIS — L905 Scar conditions and fibrosis of skin: Secondary | ICD-10-CM | POA: Diagnosis not present

## 2020-01-04 DIAGNOSIS — D1801 Hemangioma of skin and subcutaneous tissue: Secondary | ICD-10-CM | POA: Diagnosis not present

## 2020-01-04 DIAGNOSIS — Z85828 Personal history of other malignant neoplasm of skin: Secondary | ICD-10-CM | POA: Diagnosis not present

## 2020-01-04 DIAGNOSIS — L570 Actinic keratosis: Secondary | ICD-10-CM | POA: Diagnosis not present

## 2020-01-04 DIAGNOSIS — D485 Neoplasm of uncertain behavior of skin: Secondary | ICD-10-CM | POA: Diagnosis not present

## 2020-03-20 DIAGNOSIS — L905 Scar conditions and fibrosis of skin: Secondary | ICD-10-CM | POA: Diagnosis not present

## 2020-03-20 DIAGNOSIS — L570 Actinic keratosis: Secondary | ICD-10-CM | POA: Diagnosis not present

## 2020-03-20 DIAGNOSIS — C44519 Basal cell carcinoma of skin of other part of trunk: Secondary | ICD-10-CM | POA: Diagnosis not present

## 2020-03-20 DIAGNOSIS — I8393 Asymptomatic varicose veins of bilateral lower extremities: Secondary | ICD-10-CM | POA: Diagnosis not present

## 2020-03-20 DIAGNOSIS — D1801 Hemangioma of skin and subcutaneous tissue: Secondary | ICD-10-CM | POA: Diagnosis not present

## 2020-03-20 DIAGNOSIS — D485 Neoplasm of uncertain behavior of skin: Secondary | ICD-10-CM | POA: Diagnosis not present

## 2020-03-20 DIAGNOSIS — C44712 Basal cell carcinoma of skin of right lower limb, including hip: Secondary | ICD-10-CM | POA: Diagnosis not present

## 2020-03-20 DIAGNOSIS — D229 Melanocytic nevi, unspecified: Secondary | ICD-10-CM | POA: Diagnosis not present

## 2020-03-20 DIAGNOSIS — L819 Disorder of pigmentation, unspecified: Secondary | ICD-10-CM | POA: Diagnosis not present

## 2020-03-20 DIAGNOSIS — L814 Other melanin hyperpigmentation: Secondary | ICD-10-CM | POA: Diagnosis not present

## 2020-03-20 DIAGNOSIS — L821 Other seborrheic keratosis: Secondary | ICD-10-CM | POA: Diagnosis not present

## 2020-03-20 DIAGNOSIS — Z85828 Personal history of other malignant neoplasm of skin: Secondary | ICD-10-CM | POA: Diagnosis not present

## 2020-04-06 DIAGNOSIS — H52203 Unspecified astigmatism, bilateral: Secondary | ICD-10-CM | POA: Diagnosis not present

## 2020-04-06 DIAGNOSIS — Z961 Presence of intraocular lens: Secondary | ICD-10-CM | POA: Diagnosis not present

## 2020-04-06 DIAGNOSIS — H26492 Other secondary cataract, left eye: Secondary | ICD-10-CM | POA: Diagnosis not present

## 2020-04-14 ENCOUNTER — Inpatient Hospital Stay: Payer: PRIVATE HEALTH INSURANCE

## 2020-04-14 ENCOUNTER — Telehealth: Payer: MEDICARE

## 2020-04-14 DIAGNOSIS — Z719 Counseling, unspecified: Secondary | ICD-10-CM

## 2020-04-14 NOTE — Telephone Encounter
LVM regarding video appt. with Dr. Valeda Malm

## 2020-04-18 NOTE — Progress Notes
Patient Consent to Telehealth   The patient agreed to participate in the video visit prior to joining the visit.       Date of Service: 04/19/2020    CHIEF COMPLAINT: Left renal lesion     HISTORY OF PRESENT ILLNESS: Ray Strickland is a 84 y.o. male who presents today for evaluation of a left renal lesion. The patient had a renal US performed in 12/2017 due to renal insufficiency that demonstrated a 4.2 cm left inferior pole renal mass suspicious for RCC. He elected to proceed with surveillance and has been followed by Dr. Aretta Nip at [facility]. The lesion has remained overall stable. Most recent CT A/P wo+w contrast on 08/11/2019 showed ''stable to slight decrease in size of the enhancing left renal mass currently measuring 3.5 x 3.4 cm without evidence of metastatic disease.''     Karnofsky Status Karnofsky ECOG Patient Status Status   Normal, no complaints 100 0  Fully active, able to carry on all pre-disease performance without restriction   Able to carry on normal activities. Minor signs or symptoms of disease 90 0  Restricted in physically strenuous activity but ambulatory and able to carry out work of a light or sedentary nature, e.g., light house work, office work   Normal activity with effort 80 1  Restricted in physically strenuous activity but ambulatory and able to carry out work of a light or sedentary nature, e.g., light house work, office work   Care for self. Unable to carry on normal activity or to do active work 70 1  Ambulatory and capable of all selfcare but unable to carry out any work activities. Up and about more than 50% of waking hours   Requires occasional assistance, but able to care for most of his needs 60 2  Ambulatory and capable of all selfcare but unable to carry out any work activities. Up and about more than 50% of waking hours   Requires considerable assistance and frequent medical care 50 2  Capable of only limited selfcare, confined to bed or chair more than 50% of waking hours    Disabled. Requires special care and assistance 40 3  Capable of only limited selfcare, confined to bed or chair more than 50% of waking hours    Severly disabled. Hospitalisation indicated though death nonimminent 30 3  Completely disabled. Cannot carry on any selfcare. Totally confined to bed or chair    Very sick. Hospitalisation necessary. Active supportive treatment necessary 20 4  Completely disabled. Cannot carry on any selfcare. Totally confined to bed or chair    Moribund 10 4  Completely disabled. Cannot carry on any selfcare. Totally confined to bed or chair    Dead 0 5  Dead     PMH:   No past medical history on file.  - HTN  - Renal insufficiency   - Afib    PSH:   No past surgical history on file.  - Right ankle arthroplasty   - Tonsillectomy   - TURP    Social:   - Former smoker, quit in 1980    Family:   No family history on file.    Medications:   No current outpatient medications on file.     No current facility-administered medications for this visit.      Allergies:   Not on File    Exam:   General: No apparent distress. Well-appearing.    Reviewed and Interpreted Labs:  Outside labs available for review in  Care Everywhere  11/10/2019 - Cr 1.3, eGFR 51    Pathology:  None    Imaging:  I have visualized and interpreted the following scans as well as reviewed the radiologist's dictation.     01/29/2019 - CXR   1. Increased lung volumes with interval near complete resolution of previously seen bibasilar opacities, probably atelectasis. Minimal residual reticular opacity in the left retrocardiac region may represent scarring. Otherwise, no pulmonary abnormalities seen.  2. Stable borderline heart size allowing for differences in lung volumes.  Otherwise unremarkable mediastinal contours.  3. No evidence of pneumothorax or pleural effusion.    08/11/2019 - CT A/P wo+w Contrast   1. Stable to slight decrease in size of the enhancing left renal mass,currently measuring 3.5 x 3.4 cm. This is again most compatible with a renal cell carcinoma.  2. No new evidence for metastatic disease in the abdomen or pelvis.    Issues Addressed:  1. Left renal mass     Recommendations Based on Issues Addressed:  - Ray Strickland is a 84 y.o. male with a 3.5 x 3.4 cm left renal mass on surveillance since 2019.  1) We discussed the rising incidence of small renal masses due to incidental detection. Kidney cancer is now 3-4x more common today than in the 1970s, largely due to imaging   2) We discussed that the majority of renal masses are malignant, but benign tumors can be seen in 10-30% of small renal tumors.   3) We reviewed a) the role of biopsy, it has excellent positive predictive value but the negative predictive value is limited due to difficulty calling benign tumors ''benign.'' the biopsy however may help differentiated from more indolent and aggressive forms of disease as grade is accurate ~70% of the time b) the  potential role of molecular imaging with a Technitium sestamibi scan.   4) We reviewed the findings at nephrectomy for small renal tumors, >90% are fairly indolent and low grade even when malignant   5) We reviewed the natural history of small renal masses. In patients too sick for surgery, when observed, various series have shown a medial growth rate of 1-2 mm/year. The rate of distant progression is extremely low. In fact finding synchronous metastatic disease for tumors <3 cm is around 0.5% or less.    6) We reviewed treatment options for small renal masses and risks and benefits                 A) Active surveillance, we would closely image the tumor periodically and assess tumor growth characteristics. If the tumor grew at a rapid rate or became larger than I feel comfortable with (3 vs 4 cm depending on health), we would recommend delayed intervention. We could do this in conjunction with a biopsy to feel more comfortable we know what we are dealing with. While a biopsy is not mandatory, if the tumor was extremely aggressive looking, it may push the patient to intervene. We have an active surveillance protocol open with a biopsy to compare growth with genomic profiling. The rate of progression to distant disease is ~1%. For surveillance we try to use the same modality every time to compare the size of the tumor. Renal ultrasound when the tumor is visible can be very effective at decreasing costs and risks of contrast.                  B) Radical nephrectomy- this is the standard approach but has fallen out  of favor due to overtreatment and the risk of CKD/associated risks. The rate of local control for this approach is >98% with distant recurrences related to disease biology. We can often due these surgeries minimally invasively except when individuals have extensive surgery/scarring.                  C) Partial nephrectomy- this is the gold standard approach for treatment as the local treatment rate of success is >95% with similar oncologic outcome. The risk of CKD and associated harms are thought to be less from retrospective data. The risk of urine leakage and bleeding from the kidney are low, but can be significant with greater tumor complexity. Most cases can be done with a minimally invasive approach but it is often my preference to do HIGHLY complex cases with an open surgery when prolonged ischemia.                  D) Thermal ablation with percutaneous cryoablation- this is an excellent outpatient approach to treatment of small renal tumors. Its often performed without the need for general anesthesia. 1-5 needles are placed into the renal tumor depending on size and complexity and freeze the tumor to local destroy the tissue. We obtain a biopsy at the start of the case to document what was destroyed, despite the limitations of diagnosis. We perform close surveillance with imaging to document stability of the size and no enhancement. The local control rate is 80-85% at ~10 years with some patients requiring retreatment. Occasionally we have recurrences which cannot be retreated and surgical salvage becomes extremely challenging with a higher rate of complication and kidney loss. We prefer this approach when technically feasible and the patient has increased surgical risk and has limited life expectancy <10 years.         Medical Decision Making:     If Billing Based on Medical Decision Making (MDM):       1) Number and complexity of problems:     For [address] or [address] (Moderate)(check one):  []  1 or more chronic illnesses with exacerbation, progression or side effects of treatment or  []  2 or more stable chronic illnesses or  []  1 undiagnosed new problem with uncertain prognosis or  []  1 acute illness with systemic symptoms or  []  1 acute uncomplicated injury     or     For 09811 or 91478 (High)(check one):  []  1 or more chronic illnesses with severe exacerbation, progression or side effects of treatment or  []  1 acute or chronic illness or injury that poses a threat to life or bodily function        2) Amount and/or complexity of data to be reviewed and analyzed:     []  For 29562 or 13086 (Moderate): 1 out of 3 categories  []  For 57846 or [address]295 (High): 2 out of 3 categories     Category 1: Tests, documents, or independent historians (require 3 of 4 from following)  []  Review of prior external notes from each unique source  []  Review of results from each unique test  []  Ordering of each unique test  []  Assessment requiring an independent historian     Category 2: Independent interpretation of tests  []  Independent interpretation of a test performed by another physician/other qualified health care professional (not separately reported)     Category 3: Discussion of management or test interpretation  []  Discussion of management or test interpretation with external physician/other qualified health care  professional (not separately reported)        3) Risk of complications and/or morbidity or mortality of patient management:     I performed the following items on the day of service:  []  Preparing to see the patient (e.g. review of tests, review of medical records)  []  Obtaining and/or reviewing separately obtained history   []  Performing a medically appropriate examination and/or evaluation   []  Counseling and educating the patient/family/caregiver   []  Ordering medications, tests, or procedures  []  Referring and communicating with other healthcare professionals (when not separately reported)  []  Documenting clinical information in the EHR  []  Independently interpreting results and communicating results to patient/family/caregiver  []  Prescription drug management     High risk of morbidity from diagnostic testing or treatment:  []  Decision regarding diagnostic procedures with identified patient or procedure risk factors  []  Decision regarding major surgery or procedure with identified patient or procedure risk factors  []  Decision regarding hospitalization  []  Decision regarding non-operative treatments (e.g. radiation, ablation, or systemic therapy) with identified patient or procedure risk factors  []  Decision regarding surveillance imaging or procedures (e.g. CT scans, MRI, or endoscopy) with identified patient or procedure risk factors        If Billing Based on Time:      I spent the following total amount of time on these tasks on the day of service:     New Patient                                                           Established Patient  []  15-29 minutes ??? [id number]                                    []  up to 9 minutes ??? [id number]  []  30-44 minutes ??? [id number]                                    []  10-19 minutes ??? [id number]   []  45-59 minutes ??? [id number]                                    []  20-29 minutes ??? 99213   []  60-74 minutes ??? [id number]                                    []  30-39 minutes ??? 99214                                                                                 []  40-55 minutes ??? [id number]        Prolonged service (in addition to above)       []  75-89  minutes ??? 99417                                    []  55-69 minutes ??? 99417   []  90-104 minutes ??? 99417                                         []  70-84 minutes ??? 99417      []  105-120 minutes ??? 99417                                       []  85-100 minutes ??? 16[address]     CC:   Tobin Chad., MD   [address]  [place] [address]   Tel: [phone]   Fax: [phone]     Self-referral  Unknown  [place],  [place] [address]   [phone]        SCRIBE SIGNATURE(S):  I, Elige Radon, have assisted Dr. Valeda Malm, MD with the documentation for Ray Strickland on 04/18/2020 8:27 AM.

## 2020-04-19 ENCOUNTER — Ambulatory Visit: Payer: PRIVATE HEALTH INSURANCE

## 2020-04-19 ENCOUNTER — Telehealth: Payer: PRIVATE HEALTH INSURANCE

## 2020-04-19 DIAGNOSIS — N2889 Other specified disorders of kidney and ureter: Secondary | ICD-10-CM | POA: Diagnosis not present

## 2020-04-26 DIAGNOSIS — Z23 Encounter for immunization: Secondary | ICD-10-CM | POA: Diagnosis not present

## 2020-05-15 DIAGNOSIS — C44712 Basal cell carcinoma of skin of right lower limb, including hip: Secondary | ICD-10-CM | POA: Diagnosis not present

## 2020-05-15 DIAGNOSIS — C44622 Squamous cell carcinoma of skin of right upper limb, including shoulder: Secondary | ICD-10-CM | POA: Diagnosis not present

## 2020-05-15 DIAGNOSIS — D485 Neoplasm of uncertain behavior of skin: Secondary | ICD-10-CM | POA: Diagnosis not present

## 2020-05-23 DIAGNOSIS — C44622 Squamous cell carcinoma of skin of right upper limb, including shoulder: Secondary | ICD-10-CM | POA: Diagnosis not present

## 2020-05-30 DIAGNOSIS — H26492 Other secondary cataract, left eye: Secondary | ICD-10-CM | POA: Diagnosis not present

## 2020-06-13 DIAGNOSIS — Z23 Encounter for immunization: Secondary | ICD-10-CM | POA: Diagnosis not present

## 2020-07-17 DIAGNOSIS — L905 Scar conditions and fibrosis of skin: Secondary | ICD-10-CM | POA: Diagnosis not present

## 2020-07-17 DIAGNOSIS — C44519 Basal cell carcinoma of skin of other part of trunk: Secondary | ICD-10-CM | POA: Diagnosis not present

## 2020-07-17 DIAGNOSIS — L570 Actinic keratosis: Secondary | ICD-10-CM | POA: Diagnosis not present

## 2020-08-25 DIAGNOSIS — D510 Vitamin B12 deficiency anemia due to intrinsic factor deficiency: Secondary | ICD-10-CM | POA: Diagnosis not present

## 2020-08-25 DIAGNOSIS — I10 Essential (primary) hypertension: Secondary | ICD-10-CM | POA: Diagnosis not present

## 2020-08-25 DIAGNOSIS — Z125 Encounter for screening for malignant neoplasm of prostate: Secondary | ICD-10-CM | POA: Diagnosis not present

## 2020-08-30 DIAGNOSIS — N401 Enlarged prostate with lower urinary tract symptoms: Secondary | ICD-10-CM | POA: Diagnosis not present

## 2020-08-30 DIAGNOSIS — I10 Essential (primary) hypertension: Secondary | ICD-10-CM | POA: Diagnosis not present

## 2020-08-30 DIAGNOSIS — G629 Polyneuropathy, unspecified: Secondary | ICD-10-CM | POA: Diagnosis not present

## 2020-08-30 DIAGNOSIS — N5201 Erectile dysfunction due to arterial insufficiency: Secondary | ICD-10-CM | POA: Diagnosis not present

## 2020-08-30 DIAGNOSIS — F410 Panic disorder [episodic paroxysmal anxiety] without agoraphobia: Secondary | ICD-10-CM | POA: Diagnosis not present

## 2020-08-30 DIAGNOSIS — I480 Paroxysmal atrial fibrillation: Secondary | ICD-10-CM | POA: Diagnosis not present

## 2020-08-30 DIAGNOSIS — F329 Major depressive disorder, single episode, unspecified: Secondary | ICD-10-CM | POA: Diagnosis not present

## 2020-08-30 DIAGNOSIS — D696 Thrombocytopenia, unspecified: Secondary | ICD-10-CM | POA: Diagnosis not present

## 2020-08-30 DIAGNOSIS — N2889 Other specified disorders of kidney and ureter: Secondary | ICD-10-CM | POA: Diagnosis not present

## 2020-08-30 DIAGNOSIS — Z0001 Encounter for general adult medical examination with abnormal findings: Secondary | ICD-10-CM | POA: Diagnosis not present

## 2020-09-01 DIAGNOSIS — N2889 Other specified disorders of kidney and ureter: Secondary | ICD-10-CM | POA: Diagnosis not present

## 2020-09-01 DIAGNOSIS — J9811 Atelectasis: Secondary | ICD-10-CM | POA: Diagnosis not present

## 2020-09-01 DIAGNOSIS — N189 Chronic kidney disease, unspecified: Secondary | ICD-10-CM | POA: Diagnosis not present

## 2020-09-01 DIAGNOSIS — R918 Other nonspecific abnormal finding of lung field: Secondary | ICD-10-CM | POA: Diagnosis not present

## 2020-09-06 ENCOUNTER — Encounter: Payer: Self-pay | Admitting: Internal Medicine

## 2020-09-07 DIAGNOSIS — L570 Actinic keratosis: Secondary | ICD-10-CM | POA: Diagnosis not present

## 2020-09-07 DIAGNOSIS — L905 Scar conditions and fibrosis of skin: Secondary | ICD-10-CM | POA: Diagnosis not present

## 2020-09-07 DIAGNOSIS — Z85828 Personal history of other malignant neoplasm of skin: Secondary | ICD-10-CM | POA: Diagnosis not present

## 2020-09-07 DIAGNOSIS — C44329 Squamous cell carcinoma of skin of other parts of face: Secondary | ICD-10-CM | POA: Diagnosis not present

## 2020-09-07 DIAGNOSIS — C44622 Squamous cell carcinoma of skin of right upper limb, including shoulder: Secondary | ICD-10-CM | POA: Diagnosis not present

## 2020-09-07 DIAGNOSIS — D485 Neoplasm of uncertain behavior of skin: Secondary | ICD-10-CM | POA: Diagnosis not present

## 2020-09-13 DIAGNOSIS — N182 Chronic kidney disease, stage 2 (mild): Secondary | ICD-10-CM | POA: Diagnosis not present

## 2020-09-13 DIAGNOSIS — N400 Enlarged prostate without lower urinary tract symptoms: Secondary | ICD-10-CM | POA: Diagnosis not present

## 2020-09-13 DIAGNOSIS — I10 Essential (primary) hypertension: Secondary | ICD-10-CM | POA: Diagnosis not present

## 2020-09-27 DIAGNOSIS — D0439 Carcinoma in situ of skin of other parts of face: Secondary | ICD-10-CM | POA: Diagnosis not present

## 2020-09-27 DIAGNOSIS — D0461 Carcinoma in situ of skin of right upper limb, including shoulder: Secondary | ICD-10-CM | POA: Diagnosis not present

## 2020-09-27 DIAGNOSIS — C44329 Squamous cell carcinoma of skin of other parts of face: Secondary | ICD-10-CM | POA: Diagnosis not present

## 2020-10-09 DIAGNOSIS — D696 Thrombocytopenia, unspecified: Secondary | ICD-10-CM | POA: Diagnosis not present

## 2020-10-09 DIAGNOSIS — N182 Chronic kidney disease, stage 2 (mild): Secondary | ICD-10-CM | POA: Diagnosis not present

## 2020-10-09 DIAGNOSIS — N2889 Other specified disorders of kidney and ureter: Secondary | ICD-10-CM | POA: Diagnosis not present

## 2020-10-12 DIAGNOSIS — D0439 Carcinoma in situ of skin of other parts of face: Secondary | ICD-10-CM | POA: Diagnosis not present

## 2020-10-12 DIAGNOSIS — C44622 Squamous cell carcinoma of skin of right upper limb, including shoulder: Secondary | ICD-10-CM | POA: Diagnosis not present

## 2020-10-18 DIAGNOSIS — M205X1 Other deformities of toe(s) (acquired), right foot: Secondary | ICD-10-CM | POA: Diagnosis not present

## 2020-10-18 DIAGNOSIS — M19071 Primary osteoarthritis, right ankle and foot: Secondary | ICD-10-CM | POA: Diagnosis not present

## 2020-10-18 DIAGNOSIS — M7731 Calcaneal spur, right foot: Secondary | ICD-10-CM | POA: Diagnosis not present

## 2020-10-18 DIAGNOSIS — Z96661 Presence of right artificial ankle joint: Secondary | ICD-10-CM | POA: Diagnosis not present

## 2020-10-19 DIAGNOSIS — I10 Essential (primary) hypertension: Secondary | ICD-10-CM | POA: Diagnosis not present

## 2020-10-19 DIAGNOSIS — F329 Major depressive disorder, single episode, unspecified: Secondary | ICD-10-CM | POA: Diagnosis not present

## 2020-10-19 DIAGNOSIS — F410 Panic disorder [episodic paroxysmal anxiety] without agoraphobia: Secondary | ICD-10-CM | POA: Diagnosis not present

## 2020-10-19 DIAGNOSIS — I480 Paroxysmal atrial fibrillation: Secondary | ICD-10-CM | POA: Diagnosis not present

## 2020-10-27 DIAGNOSIS — I2510 Atherosclerotic heart disease of native coronary artery without angina pectoris: Secondary | ICD-10-CM | POA: Diagnosis not present

## 2020-10-27 DIAGNOSIS — I10 Essential (primary) hypertension: Secondary | ICD-10-CM | POA: Diagnosis not present

## 2020-10-27 DIAGNOSIS — F329 Major depressive disorder, single episode, unspecified: Secondary | ICD-10-CM | POA: Diagnosis not present

## 2020-10-27 DIAGNOSIS — F410 Panic disorder [episodic paroxysmal anxiety] without agoraphobia: Secondary | ICD-10-CM | POA: Diagnosis not present

## 2020-10-27 DIAGNOSIS — I480 Paroxysmal atrial fibrillation: Secondary | ICD-10-CM | POA: Diagnosis not present

## 2020-11-28 DIAGNOSIS — N2889 Other specified disorders of kidney and ureter: Secondary | ICD-10-CM | POA: Diagnosis not present

## 2020-11-28 DIAGNOSIS — F329 Major depressive disorder, single episode, unspecified: Secondary | ICD-10-CM | POA: Diagnosis not present

## 2020-11-28 DIAGNOSIS — G629 Polyneuropathy, unspecified: Secondary | ICD-10-CM | POA: Diagnosis not present

## 2020-11-28 DIAGNOSIS — I10 Essential (primary) hypertension: Secondary | ICD-10-CM | POA: Diagnosis not present

## 2020-12-02 DIAGNOSIS — M7989 Other specified soft tissue disorders: Secondary | ICD-10-CM | POA: Diagnosis not present

## 2020-12-02 DIAGNOSIS — I4891 Unspecified atrial fibrillation: Secondary | ICD-10-CM | POA: Diagnosis not present

## 2020-12-02 DIAGNOSIS — L03113 Cellulitis of right upper limb: Secondary | ICD-10-CM | POA: Diagnosis not present

## 2020-12-02 DIAGNOSIS — Z7901 Long term (current) use of anticoagulants: Secondary | ICD-10-CM | POA: Diagnosis not present

## 2020-12-02 DIAGNOSIS — Z7984 Long term (current) use of oral hypoglycemic drugs: Secondary | ICD-10-CM | POA: Diagnosis not present

## 2020-12-02 DIAGNOSIS — M25531 Pain in right wrist: Secondary | ICD-10-CM | POA: Diagnosis not present

## 2020-12-02 DIAGNOSIS — I10 Essential (primary) hypertension: Secondary | ICD-10-CM | POA: Diagnosis not present

## 2020-12-02 NOTE — ED Provider Notes (Signed)
Hand-Wrist Pain Swelling *ED        Patient:   Carl Thompson, Carl Thompson            MRN: [id number]            FIN: [phone]               Age:   85 years     Sex:  Male     DOB:  February 06, 1936   Associated Diagnoses:   Cellulitis   Author:   Dayna Ramus      Basic Information   Time seen: Provider Seen (ST)   ED Provider/Time:    Shaylyn Bawa,  Brityn Mastrogiovanni-PAC / 12/02/2020 22:01  .   Additional information: Chief Complaint from Nursing Triage Note   Chief Complaint  Chief Complaint: RIGHT WRIST PAIN AND SWELLING SINCE THURSDAY. NO KNOWN INJURY (12/02/20 21:56:00).      History of Present Illness   The patient presents with 85 year old Caucasian male coming in with a chief complaint of right wrist pain  since 2 days ago.  He noticed an abrasion on his right wrist and is otherwise increased redness in this area.  He notes no pain with wrist flexion and extension.  He notes no injury.  No numbness, tingling, sensation changes.  No fevers, immunocompromise state nor diabetes..        Review of Systems   Constitutional symptoms:  No fever, no chills, no sweats, no fatigue.    Skin symptoms:  Skin redness, no jaundice, no rash.    Eye symptoms:  Vision unchanged.   Respiratory symptoms:  No shortness of breath, no cough.    Cardiovascular symptoms:  No chest pain, no palpitations, no peripheral edema.    Gastrointestinal symptoms:  No nausea, no vomiting, no diarrhea.    Neurologic symptoms:  No headache, no numbness, no tingling.    Allergy/immunologic symptoms:  No impaired immunity,              Additional review of systems information: All other systems reviewed and otherwise negative.      Health Status   Allergies:    Allergic Reactions (Selected)  Moderate  Sulfa drugs- Rash..   Medications:  (Selected)   Inpatient Medications  Ordered  Keflex: 500 mg, 1 caps, Oral, Once  doxycycline: 100 mg, Oral, Once.      Past Medical/ Family/ Social History   Surgical history: Reviewed in chart and/or reviewed at bedside. .   Family history:  Reviewed in chart and/or reviewed at bedside. .   Social history: Reviewed in chart and/or reviewed at bedside. .   Problem list:    Active Problems (3)  Atrial fibrillation   Hypertension   Kidney mass   , Reviewed in chart and/or reviewed at bedside. Marland Kitchen      Physical Examination               Vital Signs   Vital Signs   12/02/2020 22:01 EDT Respiratory Rate 16 br/min   12/02/2020 21:56 EDT Systolic Blood Pressure 176 mmHg  HI    Diastolic Blood Pressure 96 mmHg  HI    Temperature Oral 36.8 degC    Heart Rate Monitored 79 bpm    Respiratory Rate 16 br/min    SpO2 95 %   .   Measurements   12/02/2020 22:00 EDT Body Mass Index est meas 25.25 kg/m2    Body Mass Index Measured 25.25 kg/m2   12/02/2020 21:56 EDT Height/Length Measured 178 cm  Weight Dosing 80 kg   .   Oxygen saturation.   General:  Alert, no acute distress.    Skin:  Warm, dry, pink, Area of cellulitis surrounding the dorsal side of the right wrist with a scabbed over abrasion likely the etiology of the infection.    Head:  Normocephalic, atraumatic.    Neck:  Supple, trachea midline.    Eye:  Extraocular movements are intact, normal conjunctiva, vision grossly normal.    Ears, nose, mouth and throat:  Oral mucosa moist.   Cardiovascular:  Regular rate and rhythm, No murmur, Normal peripheral perfusion.    Respiratory:  Lungs are clear to auscultation, respirations are non-labored, breath sounds are equal, Symmetrical chest wall expansion.    Back:  Normal range of motion.   Musculoskeletal:  Normal ROM, normal strength, no swelling, no deformity, Tenderness palpation overlying the cellulitic area of the right wrist.  Good active range of motion.    Chest wall   Gastrointestinal:  Soft, Nontender, Non distended.    Genitourinary   Neurological:  Alert and oriented to person, place, time, and situation, normal speech observed.    Lymphatics   Psychiatric:  Cooperative, appropriate mood & affect.       Medical Decision Making   Differential Diagnosis:   Cellulitis.   Rationale:  85 year old Caucasian male coming in with a chief complaint of redness to his progress that slowly progressing for 2 days surrounding an abrasion.  Good active range of motion of the right wrist with no fever/body excess chills, immunocompromise state nor diabetes noted by the patient so low concern for septic arthritis nor developing septic arthritis.  With no trauma nor injury, x-rays deferred.  Likely etiology of pain is cellulitis.  We will start him on antibiotics.  Warned of the side effects and how to take both antibiotics.  His primary care doctor is in [facility] and he is going to make an appointment for Monday.  Told to come back for any worsening redness or any fevers.  Patient reports understanding.  Vital signs stable and patient no acute distress upon discharge.      Impression and Plan   Diagnosis   Cellulitis (ICD10-CM L03.90, Discharge, Medical)   Plan   Condition: Improved, Stable.    Disposition: Medically cleared, Discharged: Time  12/02/2020 22:10:00, to home.    Prescriptions: Launch prescriptions   Pharmacy:  Keflex 500 mg oral capsule (Prescribe): 500 mg, 1 caps, Oral, QID, for 7 days, 28 caps, 0 Refill(s)  doxycycline monohydrate 100 mg oral capsule (Prescribe): 100 mg, 1 caps, Oral, BID, for 7 days, 14 caps, 0 Refill(s).    Patient was given the following educational materials: Cellulitis, Adult.    Follow up with: Please follow-up with your primary care doctor back in [facility] as discussed Within 1 week.    Counseled: Patient, Regarding diagnosis, Regarding diagnostic results, Regarding treatment plan, Regarding prescription, Patient indicated understanding of instructions.    Signature Line     Electronically Signed on 12/02/2020 11:08 PM EDT   ________________________________________________   Dayna Ramus      Electronically Signed on 12/18/2020 05:56 PM EDT   ________________________________________________   Sonda Rumble II-MD            Modified by: Dayna Ramus on 12/02/2020 11:08 PM EDT

## 2020-12-02 NOTE — Discharge Summary (Signed)
ED Clinical Summary                     [facility]  [address]  [place], [place], [address]  [phone]          PERSON INFORMATION  Name: Carl Thompson, Carl Thompson Age:  85 Years DOB: September 02, 1935   Sex: Male Language: English PCP: Earl Gala E-MD   Marital Status: Married Phone: [phone] Med Service: MED-Medicine   MRN: [id number] Acct# [id number] Arrival: 12/02/2020 21:52:00   Visit Reason: Wrist pain-swelling; RIGHT WRIST PAIN Acuity: 4 LOS: 000 00:29   Address:    [address] [place] [address]   Diagnosis:    Cellulitis  Medications:          New Medications  Printed Prescriptions  cephalexin (Keflex 500 mg oral capsule) 1 Capsules Oral (given by mouth) 4 times a day for 7 Days. Refills: 0.  Last Dose:____________________  doxycycline (doxycycline monohydrate 100 mg oral capsule) 1 Capsules Oral (given by mouth) 2 times a day for 7 Days. Refills: 0.  Last Dose:____________________      Medications Administered During Visit:                Medication Dose Route   cephalexin 500 mg Oral   doxycycline 100 mg Oral               Allergies      sulfa drugs (Rash)      Major Tests and Procedures:  The following procedures and tests were performed during your ED visit.  COMMON PROCEDURES%>  COMMON PROCEDURES COMMENTS%>                PROVIDER INFORMATION               Provider Role Assigned Normand Sloop, RN, [facility] ED Nurse 12/02/2020 21:58:20    Dayna Ramus ED MidLevel 12/02/2020 22:01:44        Attending Physician:  Sonda Rumble II-MD      Admit Doc  Sonda Rumble II-MD     Consulting Doc       VITALS INFORMATION  Vital Sign Triage Latest   Temp Oral ORAL_1%> ORAL%>   Temp Temporal TEMPORAL_1%> TEMPORAL%>   Temp Intravascular INTRAVASCULAR_1%> INTRAVASCULAR%>   Temp Axillary AXILLARY_1%> AXILLARY%>   Temp Rectal RECTAL_1%> RECTAL%>   02 Sat 95 % 95 %   Respiratory Rate RATE_1%> RATE%>   Peripheral Pulse Rate PULSE RATE_1%> PULSE RATE%>   Apical  Heart Rate HEART RATE_1%> HEART RATE%>   Blood Pressure BLOOD PRESSURE_1%>/ BLOOD PRESSURE_1%>96 mmHg BLOOD PRESSURE%> / BLOOD PRESSURE%>96 mmHg                 Immunizations      No Immunizations Documented This Visit          DISCHARGE INFORMATION   Discharge Disposition: H Outpt-Sent Home   Discharge Location:  Home   Discharge Date and Time:  12/02/2020 22:21:15   ED Checkout Date and Time:  12/02/2020 22:21:15     DEPART REASON INCOMPLETE INFORMATION               Depart Action Incomplete Reason   Interactive View/I&O Recently assessed               Problems      No Problems Documented              Smoking Status  No Smoking Status Documented         PATIENT EDUCATION INFORMATION  Instructions:     Cellulitis, Adult     Follow up:                   With: Address: When:   Please follow-up with your primary care doctor back in [facility] as discussed  Within 1 week              ED PROVIDER DOCUMENTATION

## 2020-12-02 NOTE — ED Notes (Signed)
 ED Patient Summary       ;       [facility]  [address], [place], [place] [address]  [phone]  Discharge Instructions (Patient)  Name: Carl Thompson, Carl Thompson  DOB: 06/16/1936                   MRN: [id number]                   FIN: [phone]  Reason For Visit: Wrist pain-swelling; RIGHT WRIST PAIN  Final Diagnosis: Cellulitis     Visit Date: 12/02/2020 21:52:00  Address: [address] [place] [address]  Phone: [phone]     Emergency Department Providers:        Primary Physician:      REEVE, KATELYN      [facility] would like to thank you for allowing us  to assist you with your healthcare needs. The following includes patient education materials and information regarding your injury/illness.     Follow-up Instructions:  You were seen today on an emergency basis. Please contact your primary care doctor for a follow up appointment. If you received a referral to a specialist doctor, it is important you follow-up as instructed.    It is important that you call your follow-up doctor to schedule and confirm the location of your next appointment. Your doctor may practice at multiple locations. The office location of your follow-up appointment may be different to the one written on your discharge instructions.    If you do not have a primary care doctor, please call [phone] for help in finding a [facility] Provider. For help in finding a specialist doctor, please call [phone].    If your condition gets worse before your follow-up with your primary care doctor or specialist, please return to the Emergency Department.      Coronavirus 2019 (COVID-19) Reminders:     Patients age 76 - 19, with parental consent, and patients over age 29 can make an appointment for a COVID-19 vaccine. Patients can contact their [facility] doctors' offices to schedule an appointment to receive the COVID-19 vaccine. Patients who do  not have a [facility] physician can call [phone] to schedule vaccination appointments.      Follow Up Appointments:  Primary Care Provider:     Name: FRANKLIN,  [name]-MD     Phone: [phone]                 With: Address: When:   Please follow-up with your primary care doctor back in [place]  as discussed  Within 1 week              [facility] SERVICES%>          New Medications  Printed Prescriptions  cephalexin (Keflex 500 mg oral capsule) 1 Capsules Oral (given by mouth) 4 times a day for 7 Days. Refills: 0.  Last Dose:____________________  doxycycline (doxycycline monohydrate 100 mg oral capsule) 1 Capsules Oral (given by mouth) 2 times a day for 7 Days. Refills: 0.  Last Dose:____________________      Allergy Info: sulfa drugs     Discharge Additional Information          Discharge Patient 12/02/20 22:11:00 EDT      Patient Education Materials:        Cellulitis, Adult  Cellulitis is a skin infection. The infected area is usually warm, red, swollen, and tender. This condition occurs most often in the arms and lower legs. The infection can travel to the muscles, blood, and underlying tissue and become serious. It is very important to get treated for this condition.      What are the causes?    Cellulitis is caused by bacteria. The bacteria enter through a break in the skin, such as a cut, burn, insect bite, open sore, or crack.      What increases the risk?    This condition is more likely to occur in people who:   Have a weak body defense system (immune system).     Have open wounds on the skin, such as cuts, burns, bites, and scrapes. Bacteria can enter the body through these open wounds.     Are older than 83 years of age.     Have diabetes.     Have a type of long-lasting (chronic) liver disease (cirrhosis) or kidney disease.     Are obese.     Have a skin condition such as:  ? Itchy rash (eczema).    ? Slow movement of blood in the veins (venous  stasis).    ? Fluid buildup below the skin (edema).       Have had radiation therapy.     Use IV drugs.        What are the signs or symptoms?    Symptoms of this condition include:   Redness, streaking, or spotting on the skin.     Swollen area of the skin.     Tenderness or pain when an area of the skin is touched.     Warm skin.     A fever.     Chills.     Blisters.        How is this diagnosed?    This condition is diagnosed based on a medical history and physical exam. You may also have tests, including:   Blood tests.     Imaging tests.        How is this treated?    Treatment for this condition may include:   Medicines, such as antibiotic medicines or medicines to treat allergies (antihistamines).     Supportive care, such as rest and application of cold or warm cloths (compresses) to the skin.     Hospital care, if the condition is severe.      The infection usually starts to get better within 1?2 days of treatment.      Follow these instructions at home:      Medicines     Take over-the-counter and prescription medicines only as told by your health care provider.     If you were prescribed an antibiotic medicine, take it as told by your health care provider. Do not stop taking the antibiotic even if you start to feel better.      General instructions     Drink enough fluid to keep your urine pale yellow.     Do not touch or rub the infected area.     Raise (elevate) the infected area above the level of your heart while you are sitting or lying down.     Apply warm or cold compresses to the affected area as told by your health care provider.     Keep all follow-up visits as told by your health care provider. This is important. These visits let  your health care provider make sure a more serious infection is not developing.        Contact a health care provider if:     You have a fever.     Your symptoms do not begin to improve within 1?2 days of starting treatment.     Your bone or joint underneath the  infected area becomes painful after the skin has healed.     Your infection returns in the same area or another area.     You notice a swollen bump in the infected area.     You develop new symptoms.     You have a general ill feeling (malaise) with muscle aches and pains.      Get help right away if:     Your symptoms get worse.     You feel very sleepy.     You develop vomiting or diarrhea that persists.     You notice red streaks coming from the infected area.     Your red area gets larger or turns dark in color.    These symptoms may represent a serious problem that is an emergency. Do not wait to see if the symptoms will go away. Get medical help right away. Call your local emergency services (911 in the [place] Do not drive yourself to the hospital.      Summary     Cellulitis is a skin infection. This condition occurs most often in the arms and lower legs.     Treatment for this condition may include medicines, such as antibiotic medicines or antihistamines.     Take over-the-counter and prescription medicines only as told by your health care provider. If you were prescribed an antibiotic medicine, do not stop taking the antibiotic even if you start to feel better.     Contact a health care provider if your symptoms do not begin to improve within 1?2 days of starting treatment or your symptoms get worse.     Keep all follow-up visits as told by your health care provider. This is important. These visits let your health care provider make sure that a more serious infection is not developing.      This information is not intended to replace advice given to you by your health care provider. Make sure you discuss any questions you have with your health care provider.      Document Revised: 08/16/2019 Document Reviewed: 12/25/2017  Elsevier Patient Education ? 2021 Elsevier Inc.      ---------------------------------------------------------------------------------------------------------------------  [facility] allows patients to review your COVID and other test results as well as discharge documents from any [facility], Emergency Department, surgical center or outpatient lab. Test results are typically available 36 hours after the test is completed.     [facility] encourages you to self-enroll in the [facility] Patient Portal.     To begin your self-enrollment process, please visit [email or web address] Under [facility], click on "Sign up now".     NOTE: You must be 16 years and older to use [facility] Self-Enroll online. If you are a parent, caregiver, or guardian; you need an invite to access your child's or dependent's health records. To obtain an invite, contact the Medical Records department at [phone] Monday through Friday, 8-4:30, select option 3 . If we receive your call afterhours, we will return your call the next business day.     If you have issues trying to create  or access your account, contact [employer] support at [phone] available 7 days a week 24 hours a day.     Comment:

## 2020-12-02 NOTE — ED Notes (Signed)
ED Patient Education Note     Patient Education Materials Follows:  Infectious Disease     Cellulitis, Adult      Cellulitis is a skin infection. The infected area is usually warm, red, swollen, and tender. This condition occurs most often in the arms and lower legs. The infection can travel to the muscles, blood, and underlying tissue and become serious. It is very important to get treated for this condition.      What are the causes?    Cellulitis is caused by bacteria. The bacteria enter through a break in the skin, such as a cut, burn, insect bite, open sore, or crack.      What increases the risk?    This condition is more likely to occur in people who:   Have a weak body defense system (immune system).     Have open wounds on the skin, such as cuts, burns, bites, and scrapes. Bacteria can enter the body through these open wounds.     Are older than 83 years of age.     Have diabetes.     Have a type of long-lasting (chronic) liver disease (cirrhosis) or kidney disease.     Are obese.     Have a skin condition such as:  ? Itchy rash (eczema).    ? Slow movement of blood in the veins (venous stasis).    ? Fluid buildup below the skin (edema).       Have had radiation therapy.     Use IV drugs.        What are the signs or symptoms?    Symptoms of this condition include:   Redness, streaking, or spotting on the skin.     Swollen area of the skin.     Tenderness or pain when an area of the skin is touched.     Warm skin.     A fever.     Chills.     Blisters.        How is this diagnosed?    This condition is diagnosed based on a medical history and physical exam. You may also have tests, including:   Blood tests.     Imaging tests.        How is this treated?    Treatment for this condition may include:   Medicines, such as antibiotic medicines or medicines to treat allergies (antihistamines).     Supportive care, such as rest and application of cold or warm cloths (compresses) to the skin.     Hospital care, if  the condition is severe.      The infection usually starts to get better within 1?2 days of treatment.      Follow these instructions at home:      Medicines     Take over-the-counter and prescription medicines only as told by your health care provider.     If you were prescribed an antibiotic medicine, take it as told by your health care provider. Do not stop taking the antibiotic even if you start to feel better.      General instructions     Drink enough fluid to keep your urine pale yellow.     Do not touch or rub the infected area.     Raise (elevate) the infected area above the level of your heart while you are sitting or lying down.     Apply warm or cold compresses to the affected area as told   by your health care provider.     Keep all follow-up visits as told by your health care provider. This is important. These visits let your health care provider make sure a more serious infection is not developing.        Contact a health care provider if:     You have a fever.     Your symptoms do not begin to improve within 1?2 days of starting treatment.     Your bone or joint underneath the infected area becomes painful after the skin has healed.     Your infection returns in the same area or another area.     You notice a swollen bump in the infected area.     You develop new symptoms.     You have a general ill feeling (malaise) with muscle aches and pains.      Get help right away if:     Your symptoms get worse.     You feel very sleepy.     You develop vomiting or diarrhea that persists.     You notice red streaks coming from the infected area.     Your red area gets larger or turns dark in color.    These symptoms may represent a serious problem that is an emergency. Do not wait to see if the symptoms will go away. Get medical help right away. Call your local emergency services (911 in the [place] Do not drive yourself to the hospital.      Summary     Cellulitis is a skin infection. This condition occurs most  often in the arms and lower legs.     Treatment for this condition may include medicines, such as antibiotic medicines or antihistamines.     Take over-the-counter and prescription medicines only as told by your health care provider. If you were prescribed an antibiotic medicine, do not stop taking the antibiotic even if you start to feel better.     Contact a health care provider if your symptoms do not begin to improve within 1?2 days of starting treatment or your symptoms get worse.     Keep all follow-up visits as told by your health care provider. This is important. These visits let your health care provider make sure that a more serious infection is not developing.      This information is not intended to replace advice given to you by your health care provider. Make sure you discuss any questions you have with your health care provider.      Document Revised: 08/16/2019 Document Reviewed: 12/25/2017  Elsevier Patient Education ? 2021 Elsevier Inc.

## 2020-12-02 NOTE — ED Notes (Signed)
ED Triage Note       ED Secondary Triage Entered On:  12/02/2020 22:01 EDT    Performed On:  12/02/2020 22:01 EDT by Andrey Campanile, RN, Sean               General Information   Barriers to Learning :   None evident   ED Home Meds Section :   Document assessment   [facility] ED Fall Risk Section :   Document assessment   ED Advance Directives Section :   Document assessment   ED Palliative Screen :   Document assessment   [name] - 12/02/2020 22:01 EDT   (As Of: 12/02/2020 22:01:38 EDT)   Problems(Active)    Atrial fibrillation (SNOMED CT  :16109604 )  Name of Problem:   Atrial fibrillation ; Recorder:   Lorel Monaco; Confirmation:   Confirmed ; Classification:   Patient Stated ; Code:   [id number] ; Contributor System:   Dietitian ; Last Updated:   12/02/2020 21:58 EDT ; Life Cycle Date:   12/02/2020 ; Life Cycle Status:   Active ; Vocabulary:   SNOMED CT        Hypertension (SNOMED CT  :1478295621 )  Name of Problem:   Hypertension ; Recorder:   Lorel Monaco; Confirmation:   Confirmed ; Classification:   Patient Stated ; Code:   [id number] ; Contributor System:   Dietitian ; Last Updated:   12/02/2020 21:59 EDT ; Life Cycle Date:   12/02/2020 ; Life Cycle Status:   Active ; Vocabulary:   SNOMED CT        Kidney mass (SNOMED CT  :629528413 )  Name of Problem:   Kidney mass ; Recorder:   Lorel Monaco; Confirmation:   Confirmed ; Classification:   Patient Stated ; Code:   [id number] ; Contributor System:   PowerChart ; Last Updated:   12/02/2020 21:59 EDT ; Life Cycle Date:   12/02/2020 ; Life Cycle Status:   Active ; Vocabulary:   SNOMED CT          Diagnoses(Active)    Wrist pain-swelling  Date:   12/02/2020 ; Diagnosis Type:   Reason For Visit ; Confirmation:   Complaint of ; Clinical Dx:   Wrist pain-swelling ; Classification:   Medical ; Clinical Service:   Emergency medicine ; Code:   PNED ; Probability:   0 ; Diagnosis Code:   [id number]             -    Procedure History   (As Of:  12/02/2020 22:01:38 EDT)     [facility] Fall Risk Assessment Tool   Hx of falling last 3 months ED Fall :   No   Patient confused or disoriented ED Fall :   No   Patient intoxicated or sedated ED Fall :   No   Patient impaired gait ED Fall :   No   Use a mobility assistance device ED Fall :   No   Patient altered elimination ED Fall :   No   [facility] ED Fall Score :   0    [name] - 12/02/2020 22:01 EDT   ED Advance Directive   Advance Directive :   No   Andrey Campanile RN, Sean - 12/02/2020 22:01 EDT   Palliative Care   Does the Patient have a Life Limiting Illness :   None of the above   [name] - 12/02/2020 22:01 EDT   Med  Hx   Medication List   (As Of: 12/02/2020 22:01:38 EDT)

## 2020-12-02 NOTE — ED Notes (Signed)
ED Triage Note       ED Triage Adult Entered On:  12/02/2020 22:00 EDT    Performed On:  12/02/2020 21:56 EDT by Lorel Monaco               Triage   Numeric Rating Pain Scale :   8   Chief Complaint :   RIGHT WRIST PAIN AND SWELLING SINCE THURSDAY. NO KNOWN INJURY   [place] Mode of Arrival :   Walking   Infectious Disease Documentation :   Document assessment   Temperature Oral :   36.8 degC(Converted to: 98.2 degF)    Heart Rate Monitored :   79 bpm   Respiratory Rate :   16 br/min   Systolic Blood Pressure :   176 mmHg (HI)    Diastolic Blood Pressure :   96 mmHg (HI)    SpO2 :   95 %   Patient presentation :   None of the above   Chief Complaint or Presentation suggest infection :   No   Weight Dosing :   80 kg(Converted to: 176 lb 6 oz)    Height :   178 cm(Converted to: 5 ft 10 in)    Body Mass Index Dosing :   25 kg/m2   Lorel Monaco - 12/02/2020 21:56 EDT   DCP GENERIC CODE   Tracking Acuity :   4   Tracking Group :   ED [employer] Tracking Group   Lorel Monaco - 12/02/2020 21:56 EDT   ED General Section :   Document assessment   Pregnancy Status :   N/A   ED Allergies Section :   Document assessment   ED Reason for Visit Section :   Document assessment   ED Quick Assessment :   Patient appears awake, alert, oriented to baseline. Skin warm and dry. Moves all extremities. Respiration even and unlabored. Appears in no apparent distress.   Lorel Monaco - 12/02/2020 21:56 EDT   ID Risk Screen Symptoms   TB Symptom Screen :   No symptoms   Close Contact with COVID-19 ID :   No   Lorel Monaco - 12/02/2020 21:56 EDT   Allergies   (As Of: 12/02/2020 22:00:14 EDT)   Allergies (Active)   sulfa drugs  Estimated Onset Date:   Unspecified ; Reactions:   Rash ; Created By:   Lorel Monaco; Reaction Status:   Active ; Category:   Drug ; Substance:   sulfa drugs ; Type:   Allergy ; Severity:   Moderate ; Updated By:   Lorel Monaco; Reviewed Date:   12/02/2020 21:58 EDT        Psycho-Social   Last 3  mo, thoughts killing self/others :   Patient denies   Right click within box for Suspected Abuse policy link. :   None   Feels Safe Where Live :   Yes   ED Behavioral Activity Rating Scale :   4 - Quiet and awake (normal level of activity)   Lorel Monaco - 12/02/2020 21:56 EDT   ED Reason for Visit   (As Of: 12/02/2020 22:00:14 EDT)   Problems(Active)    Atrial fibrillation (SNOMED CT  :63016010 )  Name of Problem:   Atrial fibrillation ; Recorder:   Lorel Monaco; Confirmation:   Confirmed ; Classification:   Patient Stated ; Code:   93235573 ; Contributor System:   Dietitian ; Last Updated:  12/02/2020 21:58 EDT ; Life Cycle Date:   12/02/2020 ; Life Cycle Status:   Active ; Vocabulary:   SNOMED CT        Hypertension (SNOMED CT  :1610960454 )  Name of Problem:   Hypertension ; Recorder:   Lorel Monaco; Confirmation:   Confirmed ; Classification:   Patient Stated ; Code:   0981191478 ; Contributor System:   Dietitian ; Last Updated:   12/02/2020 21:59 EDT ; Life Cycle Date:   12/02/2020 ; Life Cycle Status:   Active ; Vocabulary:   SNOMED CT        Kidney mass (SNOMED CT  :295621308 )  Name of Problem:   Kidney mass ; Recorder:   Lorel Monaco; Confirmation:   Confirmed ; Classification:   Patient Stated ; Code:   657846962 ; Contributor System:   PowerChart ; Last Updated:   12/02/2020 21:59 EDT ; Life Cycle Date:   12/02/2020 ; Life Cycle Status:   Active ; Vocabulary:   SNOMED CT          Diagnoses(Active)    Wrist pain-swelling  Date:   12/02/2020 ; Diagnosis Type:   Reason For Visit ; Confirmation:   Complaint of ; Clinical Dx:   Wrist pain-swelling ; Classification:   Medical ; Clinical Service:   Emergency medicine ; Code:   PNED ; Probability:   0 ; Diagnosis Code:   [id number]

## 2020-12-03 DIAGNOSIS — Z7901 Long term (current) use of anticoagulants: Secondary | ICD-10-CM | POA: Diagnosis not present

## 2020-12-03 DIAGNOSIS — M25531 Pain in right wrist: Secondary | ICD-10-CM | POA: Diagnosis not present

## 2020-12-03 DIAGNOSIS — M19031 Primary osteoarthritis, right wrist: Secondary | ICD-10-CM | POA: Diagnosis not present

## 2020-12-03 DIAGNOSIS — M064 Inflammatory polyarthropathy: Secondary | ICD-10-CM | POA: Diagnosis not present

## 2020-12-03 NOTE — ED Notes (Signed)
ED Patient Education Note     Patient Education Materials Follows:  Orthopedics     Calcium Pyrophosphate Deposition    Calcium pyrophosphate deposition (CPPD) is a type of arthritis that causes pain, swelling, and inflammation in a joint. Attacks of CPPD may come and go. The joint pain can be severe and may last for days to weeks. This condition usually affects one joint at a time. The knees are most often affected, but this condition can also affect the wrists, elbows, shoulders, or ankles.    CPPD may also be called pseudogout because it is similar to gout. Both conditions result from the buildup of crystals in a joint. However, CPPD is caused by a type of crystal that is different from the crystals that cause gout.      What are the causes?    This condition is caused by the buildup of calcium pyrophosphate dihydrate crystals in a joint. The reason why this buildup occurs is not known. An increased likelihood of having this condition (predisposition) may be passed from parent to child (is hereditary).      What increases the risk?    This condition is more likely to develop in people who:   Are older than 83 years of age.     Have a family history of CPPD.     Have certain medical conditions, such as hemophilia, amyloidosis, or overactive parathyroid glands.     Have low levels of magnesium in the blood.        What are the signs or symptoms?      Symptoms of this condition include:   Joint pain. The pain may:  ? Be intense and constant.    ? Develop quickly.    ? Get worse with movement.    ? Last from several days to a few weeks.       Redness, swelling, stiffness, and warmth at the joint.        How is this diagnosed?    To diagnose this condition, your health care provider will use a needle to remove fluid from the joint. The fluid will be examined for the crystals that cause CPPD. You also may have additional tests, such as:   Blood tests.     X-rays.     Ultrasound.     MRI.        How is this treated?     There is no way to remove the crystals from the joint and no cure for this condition. However, treatment can relieve symptoms and improve joint function. Treatment may include:   NSAIDs to reduce inflammation and pain.     Removing some of the fluid and crystals from around the joint with a needle.     Injections of medicine (cortisone) into the joint to reduce pain and swelling.     Medicines to help prevent attacks.     Physical therapy to improve joint function.        Follow these instructions at home:    Managing pain, stiffness, and swelling       Rest the affected joint until your symptoms start to go away.     If directed, put ice on the affected area to relieve pain and swelling:  ? Put ice in a plastic bag.    ? Place a towel between your skin and the bag.    ? Leave the ice on for 20 minutes, 2?3 times a day.         Keep your affected joint raised (elevated) above the level of your heart, when possible. This will help to reduce swelling. For example, prop your foot up on a chair while sitting down to elevate your knee.      General instructions     If the painful joint is in your leg, use crutches as told by your health care provider.     Take over-the-counter and prescription medicines only as told by your health care provider.     When your symptoms start to go away, begin to exercise regularly or do physical therapy. Talk with your health care provider or physical therapist about what types of exercise are safe for you. Exercise that is easier on your joints (low-impact exercise) may be best. This includes walking, swimming, bicycling, and water aerobics.     Maintain a healthy weight. Excess weight puts stress on your joints.     Keep all follow-up visits as told by your health care provider and physical therapist. This is important.        Contact a health care provider if you:     Notice that your symptoms get worse.     Develop a skin rash.     Notice that your pain gets worse.      Get help right  away if you:     Have a fever.     Have difficulty breathing.     Are taking NSAIDs and you:  ? Vomit blood.    ? Have blood in your stool.    ? Have stool that is tarry and black.        Summary     Calcium pyrophosphate deposition (CPPD) is a type of arthritis that causes pain, swelling, and inflammation in a joint. The knees are most often affected, but it can also affect the wrists, elbows, shoulders, or ankles.     CPPD is caused by the buildup of calcium crystals in a joint. The reason why this occurs is not known.     Attacks of CPPD may come and go. The joint pain can be severe and may last for days to weeks.     There is no way to remove the crystals from the joint and no cure for this condition. However, treatment can relieve symptoms and improve joint function.     Rest the affected joint until your symptoms start to go away. After your symptoms go away, begin to exercise regularly or do physical therapy.      This information is not intended to replace advice given to you by your health care provider. Make sure you discuss any questions you have with your health care provider.      Document Revised: 11/30/2019 Document Reviewed: 11/30/2019  Elsevier Patient Education ? 2021 Elsevier Inc.

## 2020-12-03 NOTE — ED Notes (Signed)
 ED Patient Summary       ;       [facility]  [address], [place], [place] [address]  [phone]  Discharge Instructions (Patient)  Name: Carl Thompson, Carl Thompson  DOB: 08/17/1936                   MRN: [id number]                   FIN: [id number]%>[phone]  Reason For Visit: UC - Wrist/Hand/Finger Pain or Swelling; RT HAND SWOLLEN AND PAIN  Final Diagnosis: Inflammatory arthritis     Visit Date: 12/03/2020 10:38:00  Address: [address] [place] [address]  Phone: [phone]     Emergency Department Providers:        Primary Physician:      MICHELINA LAMAR VEAR [facility] would like to thank you for allowing us  to assist you with your healthcare needs. The following includes patient education materials and information regarding your injury/illness.     Follow-up Instructions:  You were seen today on an emergency basis. Please contact your primary care doctor for a follow up appointment. If you received a referral to a specialist doctor, it is important you follow-up as instructed.    It is important that you call your follow-up doctor to schedule and confirm the location of your next appointment. Your doctor may practice at multiple locations. The office location of your follow-up appointment may be different to the one written on your discharge instructions.    If you do not have a primary care doctor, please call [phone] for help in finding a [facility] Provider. For help in finding a specialist doctor, please call [phone].    If your condition gets worse before your follow-up with your primary care doctor or specialist, please return to the Emergency Department.      Coronavirus 2019 (COVID-19) Reminders:     Patients age 91 - 40, with parental consent, and patients over age 50 can make an appointment for a COVID-19 vaccine. Patients can contact their [facility] doctors' offices to schedule an appointment to  receive the COVID-19 vaccine. Patients who do not have a [facility] physician can call [phone] to schedule vaccination appointments.      Follow Up Appointments:  Primary Care Provider:     Name: FRANKLIN,  [name]-MD     Phone: [phone]                 With: Address: When:   [place] [address], [address] [place], [place] [address]  [phone] Business (1) Within 3 to 5 days, only if needed              [facility] SERVICES%>          New Medications  Printed Prescriptions  oxyCODONE (oxyCODONE 5 mg oral tablet) 1 Tabs Oral (given by mouth) every 6 hours as needed severe pain (8-10). Refills: 0.  Last Dose:____________________  predniSONE (predniSONE 20 mg oral tablet) 3 Tabs Oral (given by mouth) every day for 4 Days. Refills: 0.  Last Dose:____________________  Medications that have not changed  Other Medications  amLODIPine-atorvastatin (amLODIPine-atorvastatin 5 mg-10 mg oral tablet) 1 Tabs Oral (given by mouth) every day.  Last Dose:____________________  apixaban (Eliquis 2.5 mg oral tablet) 1 Tabs Oral (given  by mouth) 2 times a day.  Last Dose:____________________  cephalexin (Keflex 500 mg oral capsule) 1 Capsules Oral (given by mouth) 4 times a day for 7 Days. Refills: 0.  Last Dose:____________________  doxycycline (doxycycline monohydrate 100 mg oral capsule) 1 Capsules Oral (given by mouth) 2 times a day for 7 Days. Refills: 0.  Last Dose:____________________      Allergy Info: sulfa drugs     Discharge Additional Information          Discharge Patient 12/03/20 11:45:00 EDT      Patient Education Materials:        Calcium Pyrophosphate Deposition    Calcium pyrophosphate deposition (CPPD) is a type of arthritis that causes pain, swelling, and inflammation in a joint. Attacks of CPPD may come and go. The joint pain can be severe and may last for days to weeks. This condition usually affects one joint at a time. The knees are most often affected,  but this condition can also affect the wrists, elbows, shoulders, or ankles.    CPPD may also be called pseudogout because it is similar to gout. Both conditions result from the buildup of crystals in a joint. However, CPPD is caused by a type of crystal that is different from the crystals that cause gout.      What are the causes?    This condition is caused by the buildup of calcium pyrophosphate dihydrate crystals in a joint. The reason why this buildup occurs is not known. An increased likelihood of having this condition (predisposition) may be passed from parent to child (is hereditary).      What increases the risk?    This condition is more likely to develop in people who:   Are older than 83 years of age.     Have a family history of CPPD.     Have certain medical conditions, such as hemophilia, amyloidosis, or overactive parathyroid glands.     Have low levels of magnesium in the blood.        What are the signs or symptoms?      Symptoms of this condition include:   Joint pain. The pain may:  ? Be intense and constant.    ? Develop quickly.    ? Get worse with movement.    ? Last from several days to a few weeks.       Redness, swelling, stiffness, and warmth at the joint.        How is this diagnosed?    To diagnose this condition, your health care provider will use a needle to remove fluid from the joint. The fluid will be examined for the crystals that cause CPPD. You also may have additional tests, such as:   Blood tests.     X-rays.     Ultrasound.     MRI.        How is this treated?    There is no way to remove the crystals from the joint and no cure for this condition. However, treatment can relieve symptoms and improve joint function. Treatment may include:   NSAIDs to reduce inflammation and pain.     Removing some of the fluid and crystals from around the joint with a needle.     Injections of medicine (cortisone) into the joint to reduce pain and swelling.     Medicines to help prevent  attacks.     Physical therapy to improve joint function.  Follow these instructions at home:    Managing pain, stiffness, and swelling       Rest the affected joint until your symptoms start to go away.     If directed, put ice on the affected area to relieve pain and swelling:  ? Put ice in a plastic bag.    ? Place a towel between your skin and the bag.    ? Leave the ice on for 20 minutes, 2?3 times a day.       Keep your affected joint raised (elevated) above the level of your heart, when possible. This will help to reduce swelling. For example, prop your foot up on a chair while sitting down to elevate your knee.      General instructions     If the painful joint is in your leg, use crutches as told by your health care provider.     Take over-the-counter and prescription medicines only as told by your health care provider.     When your symptoms start to go away, begin to exercise regularly or do physical therapy. Talk with your health care provider or physical therapist about what types of exercise are safe for you. Exercise that is easier on your joints (low-impact exercise) may be best. This includes walking, swimming, bicycling, and water aerobics.     Maintain a healthy weight. Excess weight puts stress on your joints.     Keep all follow-up visits as told by your health care provider and physical therapist. This is important.        Contact a health care provider if you:     Notice that your symptoms get worse.     Develop a skin rash.     Notice that your pain gets worse.      Get help right away if you:     Have a fever.     Have difficulty breathing.     Are taking NSAIDs and you:  ? Vomit blood.    ? Have blood in your stool.    ? Have stool that is tarry and black.        Summary     Calcium pyrophosphate deposition (CPPD) is a type of arthritis that causes pain, swelling, and inflammation in a joint. The knees are most often affected, but it can also affect the wrists, elbows, shoulders, or  ankles.     CPPD is caused by the buildup of calcium crystals in a joint. The reason why this occurs is not known.     Attacks of CPPD may come and go. The joint pain can be severe and may last for days to weeks.     There is no way to remove the crystals from the joint and no cure for this condition. However, treatment can relieve symptoms and improve joint function.     Rest the affected joint until your symptoms start to go away. After your symptoms go away, begin to exercise regularly or do physical therapy.      This information is not intended to replace advice given to you by your health care provider. Make sure you discuss any questions you have with your health care provider.      Document Revised: 11/30/2019 Document Reviewed: 11/30/2019  Elsevier Patient Education ? 2021 Elsevier Inc.      ---------------------------------------------------------------------------------------------------------------------  [facility] allows patients to review your COVID and other test results as well as discharge documents from any [facility]. [employer]  hospital, Emergency Department, surgical center or outpatient lab. Test results are typically available 36 hours after the test is completed.     [facility] encourages you to self-enroll in the [facility] Patient Portal.     To begin your self-enrollment process, please visit [email or web address] Under [facility], click on "Sign up now".     NOTE: You must be 16 years and older to use [facility] Self-Enroll online. If you are a parent, caregiver, or guardian; you need an invite to access your child's or dependent's health records. To obtain an invite, contact the Medical Records department at [phone] Monday through Friday, 8-4:30, select option 3 . If we receive your call afterhours, we will return your call the next business day.     If you have issues trying to create or access your account, contact [employer]  support at [phone] available 7 days a week 24 hours a day.     Comment:

## 2020-12-03 NOTE — ED Notes (Signed)
ED Triage Note       ED Triage Adult Entered On:  12/03/2020 10:49 EDT    Performed On:  12/03/2020 10:45 EDT by Lupita Dawn A-RN               Triage   Numeric Rating Pain Scale :   9   Chief Complaint :   possible cellulitis of right hand.  started swelling and pain since thurs.  warm to touch   Chief Complaint Onset :   11/30/2020 10:46 EDT   [place] Mode of Arrival :   Private vehicle   Infectious Disease Documentation :   Document assessment   Temperature Oral :   36.6 degC(Converted to: 97.9 degF)    Heart Rate Monitored :   66 bpm   Respiratory Rate :   16 br/min   Systolic Blood Pressure :   175 mmHg (HI)    Diastolic Blood Pressure :   99 mmHg (HI)    SpO2 :   94 %   Oxygen Therapy :   Room air   Patient presentation :   None of the above   Chief Complaint or Presentation suggest infection :   No   Dosing Weight Obtained By :   Measured   Weight Dosing :   82.7 kg(Converted to: 182 lb 5 oz)    Height :   178 cm(Converted to: 5 ft 10 in)    Body Mass Index Dosing :   26 kg/m2   Riling,  Lorrie A-RN - 12/03/2020 10:45 EDT   DCP GENERIC CODE   Tracking Acuity :   4   Tracking Group :   ED [employer]   Lupita Dawn A-RN - 12/03/2020 10:45 EDT   ED General Section :   Document assessment   Pregnancy Status :   N/A   ED Allergies Section :   Document assessment   ED Reason for Visit Section :   Document assessment   ED Quick Assessment :   Patient appears awake, alert, oriented to baseline. Skin warm and dry. Moves all extremities. Respiration even and unlabored. Appears in no apparent distress.   Riling,  Lorrie A-RN - 12/03/2020 10:45 EDT   ID Risk Screen Symptoms   Recent Travel History :   No recent travel   TB Symptom Screen :   No symptoms   Last 90 days COVID-19 ID :   No   Close Contact with COVID-19 ID :   No   Last 14 days COVID-19 ID :   No   C. diff Symptom/History ID :   Neither of the above   Riling,  Lorrie A-RN - 12/03/2020 10:45 EDT   Allergies   (As Of: 12/03/2020 10:49:56 EDT)    Allergies (Active)   sulfa drugs  Estimated Onset Date:   Unspecified ; Reactions:   Rash ; Created By:   Lorel Monaco; Reaction Status:   Active ; Category:   Drug ; Substance:   sulfa drugs ; Type:   Allergy ; Severity:   Moderate ; Updated By:   Lorel Monaco; Reviewed Date:   12/03/2020 10:49 EDT        Psycho-Social   Last 3 mo, thoughts killing self/others :   Patient denies   Right click within box for Suspected Abuse policy link. :   None   Feels Safe Where Live :   Yes   ED Behavioral Activity Rating Scale :  4 - Quiet and awake (normal level of activity)   Riling,  Lorrie A-RN - 12/03/2020 10:45 EDT   ED Reason for Visit   (As Of: 12/03/2020 10:49:56 EDT)   Problems(Active)    Atrial fibrillation (SNOMED CT  :33545625 )  Name of Problem:   Atrial fibrillation ; Recorder:   Lorel Monaco; Confirmation:   Confirmed ; Classification:   Patient Stated ; Code:   [id number] ; Contributor System:   Dietitian ; Last Updated:   12/02/2020 21:58 EDT ; Life Cycle Date:   12/02/2020 ; Life Cycle Status:   Active ; Vocabulary:   SNOMED CT        Hypertension (SNOMED CT  :2876811572 )  Name of Problem:   Hypertension ; Recorder:   Lorel Monaco; Confirmation:   Confirmed ; Classification:   Patient Stated ; Code:   [id number] ; Contributor System:   Dietitian ; Last Updated:   12/02/2020 21:59 EDT ; Life Cycle Date:   12/02/2020 ; Life Cycle Status:   Active ; Vocabulary:   SNOMED CT        Kidney mass (SNOMED CT  :638453646 )  Name of Problem:   Kidney mass ; Recorder:   Lorel Monaco; Confirmation:   Confirmed ; Classification:   Patient Stated ; Code:   [id number] ; Contributor System:   PowerChart ; Last Updated:   12/02/2020 21:59 EDT ; Life Cycle Date:   12/02/2020 ; Life Cycle Status:   Active ; Vocabulary:   SNOMED CT          Diagnoses(Active)    UC - Wrist/Hand/Finger Pain or Swelling  Date:   12/03/2020 ; Diagnosis Type:   Reason For Visit ; Confirmation:   Complaint of ; Clinical Dx:   UC -  Wrist/Hand/Finger Pain or Swelling ; Classification:   Medical ; Clinical Service:   Emergency medicine ; Code:   PNED ; Probability:   0 ; Diagnosis Code:   [id number]

## 2020-12-03 NOTE — Discharge Summary (Signed)
ED Clinical Summary                     [facility]  [address]  [place], [place], [address]  [phone]          PERSON INFORMATION  Name: Carl Thompson, Carl Thompson Age:  85 Years DOB: 1936-08-15   Sex: Male Language: English PCP: Earl Gala E-MD   Marital Status: Married Phone: [phone] Med Service: MED-Medicine   MRN: [id number] Acct# [id number] Arrival: 12/03/2020 10:38:00   Visit Reason: UC - Wrist/Hand/Finger Pain or Swelling; RT HAND SWOLLEN AND PAIN Acuity: 4 LOS: 000 01:17   Address:    [address] [place] [address]   Diagnosis:    Inflammatory arthritis  Medications:          New Medications  Printed Prescriptions  oxyCODONE (oxyCODONE 5 mg oral tablet) 1 Tabs Oral (given by mouth) every 6 hours as needed severe pain (8-10). Refills: 0.  Last Dose:____________________  predniSONE (predniSONE 20 mg oral tablet) 3 Tabs Oral (given by mouth) every day for 4 Days. Refills: 0.  Last Dose:____________________  Medications that have not changed  Other Medications  amLODIPine-atorvastatin (amLODIPine-atorvastatin 5 mg-10 mg oral tablet) 1 Tabs Oral (given by mouth) every day.  Last Dose:____________________  apixaban (Eliquis 2.5 mg oral tablet) 1 Tabs Oral (given by mouth) 2 times a day.  Last Dose:____________________  cephalexin (Keflex 500 mg oral capsule) 1 Capsules Oral (given by mouth) 4 times a day for 7 Days. Refills: 0.  Last Dose:____________________  doxycycline (doxycycline monohydrate 100 mg oral capsule) 1 Capsules Oral (given by mouth) 2 times a day for 7 Days. Refills: 0.  Last Dose:____________________      Medications Administered During Visit:                Medication Dose Route   oxyCODONE-acetaminophen 1 tabs Oral   predniSONE 60 mg Oral               Allergies      sulfa drugs (Rash)      Major Tests and Procedures:  The following procedures and tests were performed during your ED visit.  COMMON PROCEDURES%>  COMMON PROCEDURES  COMMENTS%>                PROVIDER INFORMATION               Provider Role Assigned Elita Boone H-MD ED Provider 12/03/2020 10:38:35    Lupita Dawn A-RN ED Nurse 12/03/2020 10:45:01        Attending Physician:  Maryagnes Amos H-MD      Admit Doc  Maryagnes Amos H-MD     Consulting Doc       VITALS INFORMATION  Vital Sign Triage Latest   Temp Oral ORAL_1%> ORAL%>   Temp Temporal TEMPORAL_1%> TEMPORAL%>   Temp Intravascular INTRAVASCULAR_1%> INTRAVASCULAR%>   Temp Axillary AXILLARY_1%> AXILLARY%>   Temp Rectal RECTAL_1%> RECTAL%>   02 Sat 94 % 95 %   Respiratory Rate RATE_1%> RATE%>   Peripheral Pulse Rate PULSE RATE_1%> PULSE RATE%>   Apical Heart Rate HEART RATE_1%> HEART RATE%>   Blood Pressure BLOOD PRESSURE_1%>/ BLOOD PRESSURE_1%>99 mmHg BLOOD PRESSURE%> / BLOOD PRESSURE%>88 mmHg                 Immunizations      No Immunizations Documented This Visit  DISCHARGE INFORMATION   Discharge Disposition: H Outpt-Sent Home   Discharge Location:  Home   Discharge Date and Time:  12/03/2020 11:55:57   ED Checkout Date and Time:  12/03/2020 11:55:57     DEPART REASON INCOMPLETE INFORMATION               Depart Action Incomplete Reason   Interactive View/I&O Recently assessed               Problems      No Problems Documented              Smoking Status      No Smoking Status Documented         PATIENT EDUCATION INFORMATION  Instructions:     Calcium Pyrophosphate Deposition     Follow up:                   With: Address: When:   Wilhemina Cash [address], [address] [place] [address]  [phone] Business (1) Within 3 to 5 days, only if needed              ED PROVIDER DOCUMENTATION     Patient:   Carl Thompson, Carl Thompson            MRN: [id number]            FIN: [phone]               Age:   85 years     Sex:  Male     DOB:  1935/11/29   Associated Diagnoses:   Inflammatory arthritis   Author:   Maryagnes Amos H-MD      Basic Information   Time seen: Provider Seen (ST)   ED  Provider/Time:    Maryagnes Amos H-MD / 12/03/2020 10:38  .   Additional information: Chief Complaint from Nursing Triage Note   Chief Complaint  Chief Complaint: possible cellulitis of right hand.  started swelling and pain since thurs.  warm to touch (12/03/20 10:45:00).      History of Present Illness   85 year old male returns with ongoing redness, pain and swelling of the right wrist.  He was seen here yesterday and the clinical thought at that time was that he was suffering from cellulitis.  He reports to me that this has been several weeks of discomfort in the right wrist that has acutely worsened in the last 2 days.  No fevers.  He has no risk factors for septic arthritis.  He is chronically anticoagulated.  No traumatic injury.  No previous history of known inflammatory arthritis.  Currently visiting the area from [place].      Review of Systems             Additional review of systems information: All other systems reviewed and otherwise negative.      Health Status   Allergies:    Allergic Reactions (Selected)  Moderate  Sulfa drugs- Rash..   Medications:  (Selected)   Prescriptions  Prescribed  Keflex 500 mg oral capsule: 500 mg, 1 caps, Oral, QID, for 7 days, 28 caps, 0 Refill(s)  doxycycline monohydrate 100 mg oral capsule: 100 mg, 1 caps, Oral, BID, for 7 days, 14 caps, 0 Refill(s)  Documented Medications  Documented  Eliquis 2.5 mg oral tablet: 2.5 mg, 1 tabs, Oral, BID, 60 tabs, 0 Refill(s)  amLODIPine-atorvastatin 5 mg-10 mg oral tablet: 1 tabs, Oral, Daily, 30 tabs, 0 Refill(s).  Past Medical/ Family/ Social History   Surgical history: Reviewed as documented in chart.   Family history: Reviewed as documented in chart.   Social history: Reviewed as documented in chart.   Problem list: Per nurse's notes.      Physical Examination               Vital Signs   Vital Signs   12/03/2020 10:52 EDT Respiratory Rate 16 br/min   12/03/2020 10:45 EDT Systolic Blood Pressure 175 mmHg  HI     Diastolic Blood Pressure 99 mmHg  HI    Temperature Oral 36.6 degC    Heart Rate Monitored 66 bpm    Respiratory Rate 16 br/min    SpO2 94 %   12/02/2020 22:01 EDT Respiratory Rate 16 br/min   12/02/2020 21:56 EDT Systolic Blood Pressure 176 mmHg  HI    Diastolic Blood Pressure 96 mmHg  HI    Temperature Oral 36.8 degC    Heart Rate Monitored 79 bpm    Respiratory Rate 16 br/min    SpO2 95 %   .   Measurements   12/03/2020 10:49 EDT Body Mass Index est meas 26.10 kg/m2    Body Mass Index Measured 26.10 kg/m2   12/03/2020 10:45 EDT Height/Length Measured 178 cm    Weight Dosing 82.7 kg   12/02/2020 22:00 EDT Body Mass Index est meas 25.25 kg/m2    Body Mass Index Measured 25.25 kg/m2   12/02/2020 21:56 EDT Height/Length Measured 178 cm    Weight Dosing 80 kg   .   Basic Oxygen Information   12/03/2020 10:45 EDT Oxygen Therapy Room air    SpO2 94 %   12/02/2020 21:56 EDT SpO2 95 %   .   Gen.: Alert, no acute distress or signs uncomfortable  HEENT: Pupils equal and reactive, no conjunctival injection, mucous membranes moist  Cardiovascular: Regular rate and rhythm, no audible murmur  Lungs: No respiratory distress, lung fields clear to auscultation  Abdomen: Soft, nondistended, nontender  Musculoskeletal:: Moderate right wrist effusion with some faint erythema and limited range of motion secondary to discomfort.  No peripheral edema.  No secondary lymphangitis or proximal adenopathy.  Such peripheral pulses.  Left wrist unremarkable in comparison.  Neurologic: Alert, normal gait, no focal motor or sensory deficits  Skin: Warm and dry, normal turgor      Medical Decision Making   Wrist x-ray findings  Interpretation by Emergency Physician, Chronic widening of the scapholunate junction and some mild degenerative change but no other acute findings.  See radiology report..       Reexamination/ Reevaluation   12/03/2020 11:45:52: Presentation more consistent with an inflammatory arthritis such as pseudogout.  Patient is had no  fevers or manipulation of the wrist to suggest septic arthritis and this has been an ongoing and indolent course which also leads me away from the potential for septic arthritis.  I do not see the benefit of the antibiotics prescribed yesterday as he does not appear to have a cellulitis.  He is exhibited no signs and symptoms of infectious process.  Imaging unremarkable.  I do not feel that he would benefit from immobilization as this is likely to worsen his discomfort.  I will provide a local hand surgery referral if needed.  He plans to return to [place] at the end of the week and he may follow-up at home in the next 7 to 10 days if there is no improvement in his condition.  I  had a brief discussion with the patient but I do not see high value in arthrocentesis since I have no suspicion for septic arthritis.      Impression and Plan   Diagnosis   Inflammatory arthritis (ICD10-CM M19.90, Discharge, Medical)   Plan   Condition: Stable.    Disposition: Discharged: to home.    Prescriptions: Launch prescriptions   Pharmacy:  oxyCODONE 5 mg oral tablet (Prescribe): 5 mg, 1 tabs, Oral, q6hr, PRN: severe pain (8-10), 10 tabs, 0 Refill(s)  predniSONE 20 mg oral tablet (Prescribe): 60 mg, 3 tabs, Oral, Daily, for 4 days, 12 tabs, 0 Refill(s).    Patient was given the following educational materials: Calcium Pyrophosphate Deposition.    Follow up with: Wilhemina CashEGINALD BROOKER Within 3 to 5 days, only if needed.    Counseled: Patient, Family, Regarding diagnosis, Regarding diagnostic results, Regarding treatment plan, Regarding prescription, Patient indicated understanding of instructions.

## 2020-12-03 NOTE — ED Notes (Signed)
ED Triage Note       ED Secondary Triage Entered On:  12/03/2020 10:52 EDT    Performed On:  12/03/2020 10:50 EDT by Lupita Dawn A-RN               General Information   Barriers to Learning :   None evident   Languages :   English   Last Tetanus :   Greater than 5 years   Immunizations Current :   Yes   Influenza Vaccine Status :   Received prior to admission, during current flu season   COVID-19 Vaccine Status :   2 Doses received   ED Home Meds Section :   Document assessment   [facility] ED Fall Risk Section :   Document assessment   ED Advance Directives Section :   Document assessment   ED Palliative Screen :   Document assessment   Riling,  Lorrie A-RN - 12/03/2020 10:50 EDT   (As Of: 12/03/2020 10:52:08 EDT)   Problems(Active)    Atrial fibrillation (SNOMED CT  :02542706 )  Name of Problem:   Atrial fibrillation ; Recorder:   Lorel Monaco; Confirmation:   Confirmed ; Classification:   Patient Stated ; Code:   [id number] ; Contributor System:   Dietitian ; Last Updated:   12/02/2020 21:58 EDT ; Life Cycle Date:   12/02/2020 ; Life Cycle Status:   Active ; Vocabulary:   SNOMED CT        Hypertension (SNOMED CT  :5176160737 )  Name of Problem:   Hypertension ; Recorder:   Lorel Monaco; Confirmation:   Confirmed ; Classification:   Patient Stated ; Code:   [id number] ; Contributor System:   Dietitian ; Last Updated:   12/02/2020 21:59 EDT ; Life Cycle Date:   12/02/2020 ; Life Cycle Status:   Active ; Vocabulary:   SNOMED CT        Kidney mass (SNOMED CT  :627035009 )  Name of Problem:   Kidney mass ; Recorder:   Lorel Monaco; Confirmation:   Confirmed ; Classification:   Patient Stated ; Code:   [id number] ; Contributor System:   PowerChart ; Last Updated:   12/02/2020 21:59 EDT ; Life Cycle Date:   12/02/2020 ; Life Cycle Status:   Active ; Vocabulary:   SNOMED CT          Diagnoses(Active)    UC - Wrist/Hand/Finger Pain or Swelling  Date:   12/03/2020 ; Diagnosis Type:   Reason For Visit ; Confirmation:    Complaint of ; Clinical Dx:   UC - Wrist/Hand/Finger Pain or Swelling ; Classification:   Medical ; Clinical Service:   Emergency medicine ; Code:   PNED ; Probability:   0 ; Diagnosis Code:   [id number]             -    Procedure History   (As Of: 12/03/2020 10:52:08 EDT)     [facility]ch Fall Risk Assessment Tool   Hx of falling last 3 months ED Fall :   No   Patient confused or disoriented ED Fall :   No   Patient intoxicated or sedated ED Fall :   No   Patient impaired gait ED Fall :   No   Use a mobility assistance device ED Fall :   No   Patient altered elimination ED Fall :   No   [facility] ED Fall Score :   0  Riling,  Lorrie A-RN - 12/03/2020 10:50 EDT   ED Advance Directive   Advance Directive :   No   Patient Wishes to Receive Further Information on Advance Directives :   No   Riling,  Lorrie A-RN - 12/03/2020 10:50 EDT   Palliative Care   Does the Patient have a Life Limiting Illness :   None of the above   Riling,  Lorrie A-RN - 12/03/2020 10:50 EDT   Med Hx   Medication List   (As Of: 12/03/2020 10:52:08 EDT)   Normal Order    cephalexin 500 mg Cap  :   cephalexin 500 mg Cap ; Status:   Completed ; Ordered As Mnemonic:   Keflex ; Simple Display Line:   500 mg, 1 caps, Oral, Once ; Ordering Provider:   Dayna Ramus; Catalog Code:   cephalexin ; Order Dt/Tm:   12/02/2020 22:09:15 EDT          doxycycline hyclate 100 mg Cap  :   doxycycline hyclate 100 mg Cap ; Status:   Completed ; Ordered As Mnemonic:   doxycycline ; Simple Display Line:   100 mg, 1 caps, Oral, Once ; Ordering Provider:   Dayna Ramus; Catalog Code:   doxycycline ; Order Dt/Tm:   12/02/2020 22:09:14 EDT            Prescription/Discharge Order    cephalexin  :   cephalexin ; Status:   Prescribed ; Ordered As Mnemonic:   Keflex 500 mg oral capsule ; Simple Display Line:   500 mg, 1 caps, Oral, QID, for 7 days, 28 caps, 0 Refill(s) ; Ordering Provider:   Dayna Ramus; Catalog Code:   cephalexin ; Order  Dt/Tm:   12/02/2020 22:11:04 EDT          doxycycline  :   doxycycline ; Status:   Prescribed ; Ordered As Mnemonic:   doxycycline monohydrate 100 mg oral capsule ; Simple Display Line:   100 mg, 1 caps, Oral, BID, for 7 days, 14 caps, 0 Refill(s) ; Ordering Provider:   Dayna Ramus; Catalog Code:   doxycycline ; Order Dt/Tm:   12/02/2020 22:11:00 EDT            Home Meds    amLODIPine-atorvastatin  :   amLODIPine-atorvastatin ; Status:   Documented ; Ordered As Mnemonic:   amLODIPine-atorvastatin 5 mg-10 mg oral tablet ; Simple Display Line:   1 tabs, Oral, Daily, 30 tabs, 0 Refill(s) ; Catalog Code:   amLODIPine-atorvastatin ; Order Dt/Tm:   12/03/2020 10:51:31 EDT          apixaban  :   apixaban ; Status:   Documented ; Ordered As Mnemonic:   Eliquis 2.5 mg oral tablet ; Simple Display Line:   2.5 mg, 1 tabs, Oral, BID, 60 tabs, 0 Refill(s) ; Catalog Code:   apixaban ; Order Dt/Tm:   12/03/2020 10:51:18 EDT

## 2020-12-03 NOTE — ED Provider Notes (Signed)
[facility] - Wrist/Hand/Finger Pain or Swelling        Patient:   Carl Thompson, Carl Thompson            MRN: [id number]            FIN: [phone]               Age:   85 years     Sex:  Male     DOB:  1936/03/07   Associated Diagnoses:   Inflammatory arthritis   Author:   Maryagnes Amos H-MD      Basic Information   Time seen: Provider Seen (ST)   ED Provider/Time:    Maryagnes Amos H-MD / 12/03/2020 10:38  .   Additional information: Chief Complaint from Nursing Triage Note   Chief Complaint  Chief Complaint: possible cellulitis of right hand.  started swelling and pain since thurs.  warm to touch (12/03/20 10:45:00).      History of Present Illness   85 year old male returns with ongoing redness, pain and swelling of the right wrist.  He was seen here yesterday and the clinical thought at that time was that he was suffering from cellulitis.  He reports to me that this has been several weeks of discomfort in the right wrist that has acutely worsened in the last 2 days.  No fevers.  He has no risk factors for septic arthritis.  He is chronically anticoagulated.  No traumatic injury.  No previous history of known inflammatory arthritis.  Currently visiting the area from [place].      Review of Systems             Additional review of systems information: All other systems reviewed and otherwise negative.      Health Status   Allergies:    Allergic Reactions (Selected)  Moderate  Sulfa drugs- Rash..   Medications:  (Selected)   Prescriptions  Prescribed  Keflex 500 mg oral capsule: 500 mg, 1 caps, Oral, QID, for 7 days, 28 caps, 0 Refill(s)  doxycycline monohydrate 100 mg oral capsule: 100 mg, 1 caps, Oral, BID, for 7 days, 14 caps, 0 Refill(s)  Documented Medications  Documented  Eliquis 2.5 mg oral tablet: 2.5 mg, 1 tabs, Oral, BID, 60 tabs, 0 Refill(s)  amLODIPine-atorvastatin 5 mg-10 mg oral tablet: 1 tabs, Oral, Daily, 30 tabs, 0 Refill(s).      Past Medical/ Family/ Social History   Surgical history: Reviewed as documented  in chart.   Family history: Reviewed as documented in chart.   Social history: Reviewed as documented in chart.   Problem list: Per nurse's notes.      Physical Examination               Vital Signs   Vital Signs   12/03/2020 10:52 EDT Respiratory Rate 16 br/min   12/03/2020 10:45 EDT Systolic Blood Pressure 175 mmHg  HI    Diastolic Blood Pressure 99 mmHg  HI    Temperature Oral 36.6 degC    Heart Rate Monitored 66 bpm    Respiratory Rate 16 br/min    SpO2 94 %   12/02/2020 22:01 EDT Respiratory Rate 16 br/min   12/02/2020 21:56 EDT Systolic Blood Pressure 176 mmHg  HI    Diastolic Blood Pressure 96 mmHg  HI    Temperature Oral 36.8 degC    Heart Rate Monitored 79 bpm    Respiratory Rate 16 br/min    SpO2 95 %   .  Measurements   12/03/2020 10:49 EDT Body Mass Index est meas 26.10 kg/m2    Body Mass Index Measured 26.10 kg/m2   12/03/2020 10:45 EDT Height/Length Measured 178 cm    Weight Dosing 82.7 kg   12/02/2020 22:00 EDT Body Mass Index est meas 25.25 kg/m2    Body Mass Index Measured 25.25 kg/m2   12/02/2020 21:56 EDT Height/Length Measured 178 cm    Weight Dosing 80 kg   .   Basic Oxygen Information   12/03/2020 10:45 EDT Oxygen Therapy Room air    SpO2 94 %   12/02/2020 21:56 EDT SpO2 95 %   .   Gen.: Alert, no acute distress or signs uncomfortable  HEENT: Pupils equal and reactive, no conjunctival injection, mucous membranes moist  Cardiovascular: Regular rate and rhythm, no audible murmur  Lungs: No respiratory distress, lung fields clear to auscultation  Abdomen: Soft, nondistended, nontender  Musculoskeletal:: Moderate right wrist effusion with some faint erythema and limited range of motion secondary to discomfort.  No peripheral edema.  No secondary lymphangitis or proximal adenopathy.  Such peripheral pulses.  Left wrist unremarkable in comparison.  Neurologic: Alert, normal gait, no focal motor or sensory deficits  Skin: Warm and dry, normal turgor      Medical Decision Making   Wrist x-ray findings   Interpretation by Emergency Physician, Chronic widening of the scapholunate junction and some mild degenerative change but no other acute findings.  See radiology report..       Reexamination/ Reevaluation   12/03/2020 11:45:52: Presentation more consistent with an inflammatory arthritis such as pseudogout.  Patient is had no fevers or manipulation of the wrist to suggest septic arthritis and this has been an ongoing and indolent course which also leads me away from the potential for septic arthritis.  I do not see the benefit of the antibiotics prescribed yesterday as he does not appear to have a cellulitis.  He is exhibited no signs and symptoms of infectious process.  Imaging unremarkable.  I do not feel that he would benefit from immobilization as this is likely to worsen his discomfort.  I will provide a local hand surgery referral if needed.  He plans to return to [place] at the end of the week and he may follow-up at home in the next 7 to 10 days if there is no improvement in his condition.  I had a brief discussion with the patient but I do not see high value in arthrocentesis since I have no suspicion for septic arthritis.      Impression and Plan   Diagnosis   Inflammatory arthritis (ICD10-CM M19.90, Discharge, Medical)   Plan   Condition: Stable.    Disposition: Discharged: to home.    Prescriptions: Launch prescriptions   Pharmacy:  oxyCODONE 5 mg oral tablet (Prescribe): 5 mg, 1 tabs, Oral, q6hr, PRN: severe pain (8-10), 10 tabs, 0 Refill(s)  predniSONE 20 mg oral tablet (Prescribe): 60 mg, 3 tabs, Oral, Daily, for 4 days, 12 tabs, 0 Refill(s).    Patient was given the following educational materials: Calcium Pyrophosphate Deposition.    Follow up with: Wilhemina Cash Within 3 to 5 days, only if needed.    Counseled: Patient, Family, Regarding diagnosis, Regarding diagnostic results, Regarding treatment plan, Regarding prescription, Patient indicated understanding of instructions.    Signature  Line     Electronically Signed on 12/03/2020 11:45 AM EDT   ________________________________________________   Maryagnes Amos H-MD  Modified by: Maryagnes Amos H-MD on 12/03/2020 11:45 AM EDT

## 2020-12-09 DIAGNOSIS — M25531 Pain in right wrist: Secondary | ICD-10-CM | POA: Diagnosis not present

## 2020-12-19 DIAGNOSIS — L570 Actinic keratosis: Secondary | ICD-10-CM | POA: Diagnosis not present

## 2020-12-21 DIAGNOSIS — N138 Other obstructive and reflux uropathy: Secondary | ICD-10-CM | POA: Diagnosis not present

## 2020-12-21 DIAGNOSIS — N401 Enlarged prostate with lower urinary tract symptoms: Secondary | ICD-10-CM | POA: Diagnosis not present

## 2020-12-21 DIAGNOSIS — N2889 Other specified disorders of kidney and ureter: Secondary | ICD-10-CM | POA: Diagnosis not present

## 2020-12-29 DIAGNOSIS — L570 Actinic keratosis: Secondary | ICD-10-CM | POA: Diagnosis not present

## 2021-01-03 ENCOUNTER — Other Ambulatory Visit: Payer: Self-pay

## 2021-01-03 ENCOUNTER — Encounter: Payer: Self-pay | Admitting: Internal Medicine

## 2021-01-03 ENCOUNTER — Ambulatory Visit (INDEPENDENT_AMBULATORY_CARE_PROVIDER_SITE_OTHER): Payer: Medicare Other | Admitting: Internal Medicine

## 2021-01-03 VITALS — BP 162/90 | HR 52 | Ht 70.0 in | Wt 180.0 lb

## 2021-01-03 DIAGNOSIS — E782 Mixed hyperlipidemia: Secondary | ICD-10-CM | POA: Diagnosis not present

## 2021-01-03 DIAGNOSIS — I4891 Unspecified atrial fibrillation: Secondary | ICD-10-CM | POA: Diagnosis not present

## 2021-01-03 DIAGNOSIS — I10 Essential (primary) hypertension: Secondary | ICD-10-CM

## 2021-01-03 MED ORDER — CARVEDILOL 3.125 MG PO TABS: 3.1250 mg | ORAL_TABLET | Freq: Two times a day (BID) | ORAL | 3 refills | Status: DC

## 2021-01-03 MED ORDER — AMLODIPINE BESYLATE 5 MG PO TABS: 7.5000 mg | ORAL_TABLET | Freq: Every day | ORAL | 3 refills | Status: DC

## 2021-01-03 NOTE — Progress Notes (Signed)
OFFICE NOTE  Chief Complaint:  Establish cardiologist  Primary Care Physician: Deland Pretty, MD2  HPI:  Nicholas Wood is a 85 y.o. male with a past medial history significant for atrial flutter/fibrillation status post catheter ablation at [facility], hypertension and a family history of heart failure in his father.  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  He was previously followed by Dr. Tollie Eth until his retirement.  Recently his PCP Dr. Concha Pyo had obtained a coronary calcium score through [facility].  I reviewed this and demonstrated his total calcium score of 157, commented to be between 0 and 25th percentile.  In addition the reader mention "definite, at least moderate atherosclerotic plaque.  Risk of coronary artery disease: Mild coronary artery disease highly likely, significant narrowings possible."  This is difficult to interpret however seems to suggest some mild nonobstructive coronary disease.  Of note the calcium score is quite low given his age as mentioned and puts him at a very low risk percentile.  This comes up as the patient is quite hesitant to take statins due to his concern about side effects.  He notes that his wife was significantly debilitated from statins.  He is also struggled recently with bradycardia and had adjusted some of his medications including decreasing his carvedilol from 6.25 twice a day to only once a day at noon.  He is also cut back apixaban from 5 mg twice daily to 2.5 mg twice daily due to low platelets and anemia.  He demonstrated a blood pressure log to me today which shows persistently elevated blood pressures typically around 161W to 960A systolic.  He feels that they are not adequately controlled.  PMHx:  Past Medical History:  Diagnosis Date  . Atrial flutter ([place])   . Hypertension   . Paroxysmal atrial fibrillation [facility])     Past Surgical History:  Procedure  Laterality Date  . TONSILLECTOMY    . TOTAL ANKLE REPLACEMENT    . TRANSURETHRAL RESECTION OF PROSTATE  07/01/2002   Benign prostatic hypertrophy with bladder outlet obstruction    FAMHx:  Family History  Problem Relation Age of Onset  . Heart failure Father   . Alzheimer's disease Mother   . Diabetes Daughter        Obesity    SOCHx:   reports that he has never smoked. He has never used smokeless tobacco. He reports current alcohol use. He reports that he does not use drugs.  ALLERGIES:  No Known Allergies  ROS: Pertinent items noted in HPI and remainder of comprehensive ROS otherwise negative.  HOME MEDS: Current Outpatient Medications on File Prior to Visit  Medication Sig Dispense Refill  . ALPRAZolam (XANAX) 0.5 MG tablet Take 0.5 mg by mouth daily as needed.    Marland Kitchen apixaban (ELIQUIS) 5 MG TABS tablet Take 1 tablet (5 mg total) by mouth 2 (two) times daily. Please contact cardiologist for f/u prior to next refill authorization. 60 tablet 3  . Azilsartan Medoxomil (EDARBI) 80 MG TABS     . Cholecalciferol 25 MCG (1000 UT) tablet Take by mouth.    . cyanocobalamin (,VITAMIN B-12,) 1000 MCG/ML injection Inject 1,000 mcg into the muscle every 30 (thirty) days.    . tamsulosin (FLOMAX) 0.4 MG CAPS capsule Take by mouth.    . triamterene (DYRENIUM) 100 MG capsule Take by mouth.    . zaleplon (SONATA) 5 MG capsule Take  5 mg by mouth at bedtime.    . Zolpidem Tartrate (AMBIEN PO) Take 1 tablet by mouth as needed (for sleep).     No current facility-administered medications on file prior to visit.    LABS/IMAGING: No results found for this or any previous visit (from the past 48 hour(s)). No results found.  LIPID PANEL: No results found for: CHOL, TRIG, HDL, CHOLHDL, VLDL, LDLCALC, LDLDIRECT   WEIGHTS: Wt Readings from Last 3 Encounters:  01/03/21 180 lb (81.6 kg)  10/05/15 185 lb 12.8 oz (84.3 kg)  08/31/15 189 lb 12.8 oz (86.1 kg)    VITALS: BP (!) 162/90   Pulse  (!) 52   Ht 5\' 10"  (1.778 m)   Wt 180 lb (81.6 kg)   SpO2 97%   BMI 25.83 kg/m   EXAM: General appearance: alert and no distress Neck: no carotid bruit, no JVD and thyroid not enlarged, symmetric, no tenderness/mass/nodules Lungs: clear to auscultation bilaterally Heart: regular rate and rhythm, S1, S2 normal, no murmur, click, rub or gallop Abdomen: soft, non-tender; bowel sounds normal; no masses,  no organomegaly Extremities: extremities normal, atraumatic, no cyanosis or edema Pulses: 2+ and symmetric Skin: Skin color, texture, turgor normal. No rashes or lesions Neurologic: Grossly normal Psych: Pleasant  * Examination was chaperoned by Sheral Apley, RN.   EKG: Sinus bradycardia 52- personally reviewed  ASSESSMENT: 1. History of atrial fibrillation/flutter-status post catheter ablation at [facility], previously seen by Dr. Rayann Heman in 2017 on flecainide but he is not taking that medication. 2. Essential hypertension-not at goal 3. Mildly elevated CAC score of 157, low risk percentile 4. Statin averse  PLAN: 1.   [name] he is hesitant to take statins and based on his recent lipid profile which showed an LDL of 97 not on therapy and a relatively low calcium score given his age, it may not be unreasonable to avoid statins in this individual if he has concerns about side effects.  His blood pressure remains higher than desirable.  We discussed medication options and would recommend increasing his amlodipine from 5 to 7.5 mg daily.  We will then also divide his carvedilol in half to 3.125 mg but dose it twice daily to allow better daytime coverage rather than taking a short acting medication with a short half-life once daily.  Plan follow-up with me in a few months to reassess her blood pressure.  Thanks again for the kind referral.  Pixie Casino, MD, FACC, [place] Director of the Advanced Lipid Disorders &  Cardiovascular Risk  Reduction Clinic Diplomate of the American Board of Clinical Lipidology Attending Cardiologist  Direct Dial: [phone]  Fax: [phone]  Website:  [email or web address] 01/03/2021, 5:06 PM

## 2021-01-03 NOTE — Patient Instructions (Signed)
Medication Instructions:  INCREASE amlodipine to 7.5mg  daily (one and one-half of the 5mg  tablets) DECREASE carvedilol to 3.125mg  twice daily   *If you need a refill on your cardiac medications before your next appointment, please call your pharmacy*  Follow-Up: At [facility], you and your health needs are our priority.  As part of our continuing mission to provide you with exceptional heart care, we have created designated Provider Care Teams.  These Care Teams include your primary Cardiologist (physician) and Advanced Practice Providers (APPs -  Physician Assistants and Nurse Practitioners) who all work together to provide you with the care you need, when you need it.  We recommend signing up for the patient portal called "MyChart".  Sign up information is provided on this After Visit Summary.  MyChart is used to connect with patients for Virtual Visits (Telemedicine).  Patients are able to view lab/test results, encounter notes, upcoming appointments, etc.  Non-urgent messages can be sent to your provider as well.   To learn more about what you can do with MyChart, go to [email or web address].    Your next appointment:   3-4 month(s)  The format for your next appointment:   In Person  Provider:   You may see Dr. Debara Pickett or one of the following Advanced Practice Providers on your designated Care Team:    Almyra Deforest, PA-C  Fabian Sharp, [place] or   Roby Lofts, [place]    Other Instructions

## 2021-01-09 DIAGNOSIS — R200 Anesthesia of skin: Secondary | ICD-10-CM | POA: Diagnosis not present

## 2021-01-09 DIAGNOSIS — M25531 Pain in right wrist: Secondary | ICD-10-CM | POA: Diagnosis not present

## 2021-01-10 DIAGNOSIS — N401 Enlarged prostate with lower urinary tract symptoms: Secondary | ICD-10-CM | POA: Diagnosis not present

## 2021-01-10 DIAGNOSIS — N138 Other obstructive and reflux uropathy: Secondary | ICD-10-CM | POA: Diagnosis not present

## 2021-02-01 DIAGNOSIS — N138 Other obstructive and reflux uropathy: Secondary | ICD-10-CM | POA: Diagnosis not present

## 2021-02-01 DIAGNOSIS — N401 Enlarged prostate with lower urinary tract symptoms: Secondary | ICD-10-CM | POA: Diagnosis not present

## 2021-02-08 DIAGNOSIS — R200 Anesthesia of skin: Secondary | ICD-10-CM | POA: Diagnosis not present

## 2021-02-12 DIAGNOSIS — N401 Enlarged prostate with lower urinary tract symptoms: Secondary | ICD-10-CM | POA: Diagnosis not present

## 2021-02-12 DIAGNOSIS — N138 Other obstructive and reflux uropathy: Secondary | ICD-10-CM | POA: Diagnosis not present

## 2021-02-15 DIAGNOSIS — Z466 Encounter for fitting and adjustment of urinary device: Secondary | ICD-10-CM | POA: Diagnosis not present

## 2021-02-21 DIAGNOSIS — G5601 Carpal tunnel syndrome, right upper limb: Secondary | ICD-10-CM | POA: Diagnosis not present

## 2021-02-23 DIAGNOSIS — N138 Other obstructive and reflux uropathy: Secondary | ICD-10-CM | POA: Diagnosis not present

## 2021-02-23 DIAGNOSIS — N401 Enlarged prostate with lower urinary tract symptoms: Secondary | ICD-10-CM | POA: Diagnosis not present

## 2021-02-27 DIAGNOSIS — D0362 Melanoma in situ of left upper limb, including shoulder: Secondary | ICD-10-CM | POA: Diagnosis not present

## 2021-02-27 DIAGNOSIS — D485 Neoplasm of uncertain behavior of skin: Secondary | ICD-10-CM | POA: Diagnosis not present

## 2021-02-27 DIAGNOSIS — L905 Scar conditions and fibrosis of skin: Secondary | ICD-10-CM | POA: Diagnosis not present

## 2021-02-27 DIAGNOSIS — C44311 Basal cell carcinoma of skin of nose: Secondary | ICD-10-CM | POA: Diagnosis not present

## 2021-02-27 DIAGNOSIS — Z85828 Personal history of other malignant neoplasm of skin: Secondary | ICD-10-CM | POA: Diagnosis not present

## 2021-02-27 DIAGNOSIS — G5601 Carpal tunnel syndrome, right upper limb: Secondary | ICD-10-CM | POA: Diagnosis not present

## 2021-02-27 DIAGNOSIS — C4441 Basal cell carcinoma of skin of scalp and neck: Secondary | ICD-10-CM | POA: Diagnosis not present

## 2021-03-02 DIAGNOSIS — I10 Essential (primary) hypertension: Secondary | ICD-10-CM | POA: Diagnosis not present

## 2021-03-02 DIAGNOSIS — F411 Generalized anxiety disorder: Secondary | ICD-10-CM | POA: Diagnosis not present

## 2021-03-02 DIAGNOSIS — F339 Major depressive disorder, recurrent, unspecified: Secondary | ICD-10-CM | POA: Diagnosis not present

## 2021-03-27 DIAGNOSIS — L989 Disorder of the skin and subcutaneous tissue, unspecified: Secondary | ICD-10-CM | POA: Diagnosis not present

## 2021-03-27 DIAGNOSIS — C4362 Malignant melanoma of left upper limb, including shoulder: Secondary | ICD-10-CM | POA: Diagnosis not present

## 2021-03-27 DIAGNOSIS — C4441 Basal cell carcinoma of skin of scalp and neck: Secondary | ICD-10-CM | POA: Diagnosis not present

## 2021-03-30 DIAGNOSIS — F411 Generalized anxiety disorder: Secondary | ICD-10-CM | POA: Diagnosis not present

## 2021-03-30 DIAGNOSIS — F339 Major depressive disorder, recurrent, unspecified: Secondary | ICD-10-CM | POA: Diagnosis not present

## 2021-03-30 DIAGNOSIS — I10 Essential (primary) hypertension: Secondary | ICD-10-CM | POA: Diagnosis not present

## 2021-04-03 NOTE — Progress Notes (Signed)
Cardiology Office Note   Date:  04/13/2021   ID:  Nicholas, Wood [birth year], MRN [id number]  PCP:  Deland Pretty, MD  Cardiologist:  Pixie Casino, MD EP: None  Chief Complaint  Patient presents with   Follow-up    HTN, PAF, preop       History of Present Illness: Nicholas Wood is a 85 y.o. 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 Debara Pickett 01/03/21, at which time he he reported hesitancy to start statin due to concerns for side effects. He had decreased his carvedilol to 6.'25mg'$  once daily due to some bradycardia and reduced his apixaban to 2.'5mg'$  BID due to low platelets and anemia. His BP was elevated above goal and he was recommended to increase amlodipine to 7.'5mg'$  daily and split his carvedilol dose to 3.'125mg'$  BID. He was recommended to follow-up in 3 months for close monitoring of his BP.   His atrial fibrillation history dates back to 2006. He has previously been followed at [facility] for electrophysiology care. He has undergone cardioversion and ultimately ablation in 2019 for management of his atrial fibrillation. He has been on multaq in the past but developed a drug-rash and since has been maintained on flecainide. He had a calcium score study 08/2020 which showed calcium score of 157 and suspected moderate atherosclerotic plaque and at least mild CAD.   He presents today for routine follow-up. He had a recent MOHS surgery for melanoma on his left hand with successful removal of cancerous cells. He tolerated to procedure without complications and the site is healing well. He alos reports recent visit with Urology at [facility] and is recommending TURP procedure 05/2021 pending cardiac clearance. From a heart perspective he has continued to do well. He is active, walking 2 miles per  day and going up stairs numerous times a day. He has can easily complete 4 METs without anginal complaints. He has minimal LE edema which he attributes to his amlodipine and is resolved upon waking each morning. He has chronic mild DOE when ambulating up several flights of stairs which is unchanged in recent months. He has no complaints of chest pain, SOB at rest, palpitations, dizziness, lightheadedness, syncope, orthopnea, or PND. He reports BP at home continues to be consistently with SBP >130s-140s. He has been taking amlodipine '5mg'$  daily (did not adjust dose after his last visit with Dr. Debara Pickett).      Past Medical History:  Diagnosis Date   Atrial flutter ([place])    Hypertension    Paroxysmal atrial fibrillation [facility])     Past Surgical History:  Procedure Laterality Date   TONSILLECTOMY     TOTAL ANKLE REPLACEMENT     TRANSURETHRAL RESECTION OF PROSTATE  07/01/2002   Benign prostatic hypertrophy with bladder outlet obstruction     Current Outpatient Medications  Medication Sig Dispense Refill   ALPRAZolam (XANAX) 0.5 MG tablet Take 0.5 mg by mouth daily as needed.     apixaban (ELIQUIS) 2.5 MG TABS tablet Take 1 tablet (2.5 mg total) by mouth 2 (two) times daily. 60 tablet 11   Azilsartan Medoxomil (EDARBI) 80 MG TABS Take 40 mg by mouth daily.     buPROPion (WELLBUTRIN XL) 150 MG 24 hr tablet Take 150 mg by mouth daily.     carvedilol (COREG)  3.125 MG tablet Take 1 tablet (3.125 mg total) by mouth 2 (two) times daily with a meal. 180 tablet 3   Cholecalciferol 25 MCG (1000 UT) tablet Take by mouth.     clonazePAM (KLONOPIN) 0.5 MG tablet Take 1-2 mg by mouth daily.     cyanocobalamin (,VITAMIN B-12,) 1000 MCG/ML injection Inject 1,000 mcg into the muscle every 30 (thirty) days.     potassium chloride (KLOR-CON) 10 MEQ tablet Take 1 tablet (10 mEq total) by mouth daily. 30 tablet 11   tamsulosin (FLOMAX) 0.4 MG CAPS capsule Take by mouth.     triamterene (DYRENIUM) 100 MG capsule  Take by mouth.     zaleplon (SONATA) 5 MG capsule Take 5 mg by mouth at bedtime.     Zolpidem Tartrate (AMBIEN PO) Take 1 tablet by mouth as needed (for sleep).     amLODipine (NORVASC) 5 MG tablet Take 1.5 tablets (7.5 mg total) by mouth daily. 45 tablet 11   No current facility-administered medications for this visit.    Allergies:   Patient has no known allergies.    Social History:  The patient  reports that he has never smoked. He has never used smokeless tobacco. He reports current alcohol use. He reports that he does not use drugs.   Family History:  The patient's family history includes Alzheimer's disease in his mother; Diabetes in his daughter; Heart failure in his father.    ROS:  Please see the history of present illness.   Otherwise, review of systems are positive for none.   All other systems are reviewed and negative.    PHYSICAL EXAM: VS:  BP 139/80   Pulse 72   Ht '5\' 10"'$  (1.778 m)   Wt 174 lb 12.8 oz (79.3 kg)   SpO2 97%   BMI 25.08 kg/m  , BMI Body mass index is 25.08 kg/m. GEN: Well nourished, well developed, in no acute distress HEENT: sclera anicteric Neck: no JVD, carotid bruits, or masses Cardiac: RRR; no murmurs, rubs, or gallops, no edema  Respiratory:  clear to auscultation bilaterally, normal work of breathing GI: soft, nontender, nondistended, + BS MS: no deformity or atrophy Skin: warm and dry, no rash Neuro:  Strength and sensation are intact Psych: euthymic mood, full affect   EKG:  EKG is not ordered today.    Recent Labs: No results found for requested labs within last 8760 hours.    Lipid Panel No results found for: CHOL, TRIG, HDL, CHOLHDL, VLDL, LDLCALC, LDLDIRECT    Wt Readings from Last 3 Encounters:  04/13/21 174 lb 12.8 oz (79.3 kg)  01/03/21 180 lb (81.6 kg)  10/05/15 185 lb 12.8 oz (84.3 kg)      Other studies Reviewed: Additional studies/ records that were reviewed today include:   Calcium score  08/2020: IMPRESSION:  1.  Total calcium score of 157 is between the 0 and 25 percentile for males above the age of 54.  2.  Definite, at least moderate atherosclerotic plaque. Risk of coronary artery disease: Mild coronary artery disease highly likely, significant narrowings possible.   Echocardiogram 2019:  INTERPRETATION ---------------------------------------------------------------    MILD LV DYSFUNCTION (See above) WITH MILD LVH    NORMAL RIGHT VENTRICULAR SYSTOLIC FUNCTION    VALVULAR REGURGITATION: TRIVIAL MR, MILD PR, TRIVIAL TR    NO VALVULAR STENOSIS    INTERATRIAL SHUNT SEEN BY COLOR AND SPECTRAL DOPPLER    S/P ABLATION; IRREGULAR CARDIAC RHYTHM THROUGHOUT EXAM    NO PRIOR STUDY  FOR COMPARISON    3D acquisition and reconstructions were performed as part of this    examination to more accurately quantify the effects of reduced left    ventricular ejection fraction. (post-processing on an Independent    workstation).    ASSESSMENT AND PLAN:  1. HTN: BP 139/80 today which is what he runs at home. He did not increase amlodipine following his last visit - Will increase amlodipine to 7.'5mg'$  daily - Continue carvedilol   2. Paroxysmal atrial fibrillation: s/p ablation in 2019. Remains on flecainide for rhythm control. He presents recent blood work showing potassium 3.6. He reports trouble with arrhythmias when potassium is low and asks about utility of supplementing - Okay to start potassium 10 mEq daily - will have him come back in 2 weeks for BMET for close monitoring - Continue carvedilol for rate control - Continue flecainide for rhythm control - Continue apixaban for stroke ppx - he does not meet requirements for dose reduced apixaban  3. Presumed non-obstructive CAD: calcium score 08/2020 suggested at least mild CAD. No anginal complaints. Decision made to avoid statins at his last visit due to concern for side effects. - Continue Bblocker  4. HLD: LDL 97 08/2020. Decision  made to avoid statins due to concern for side effects - Continue dietary/lifestyle modifications to promote lower cholesterol levels  5. Preop assessment: he is anticipating upcoming TURP for management of LUTS with BPH. Following with Dr. Izell Leon[place] with urology at [facility]. He can easily complete 4 METs without anginal complaints - Based on ACC/AHA guidelines, Nicholas Wood would be at acceptable risk for the planned procedure without further cardiovascular testing.  - The patient was advised that if he develops new symptoms prior to surgery to contact our office to arrange for a follow-up visit, and he verbalized understanding. - I will route this recommendation to the requesting party via [place] fax function  - Per pharmacy recommendations, patient can hold eliquis 3 days prior to his upcoming procedure with plans to restart when cleared to do so by pharmacy   Current medicines are reviewed at length with the patient today.  The patient does not have concerns regarding medicines.  The following changes have been made:  As above  Labs/ tests ordered today include:   Orders Placed This Encounter  Procedures   Basic metabolic panel     Disposition:   FU with Dr. Debara Pickett in 6 months  Signed, Abigail Butts, PA-C  04/13/2021 10:10 AM

## 2021-04-11 DIAGNOSIS — N401 Enlarged prostate with lower urinary tract symptoms: Secondary | ICD-10-CM | POA: Diagnosis not present

## 2021-04-11 DIAGNOSIS — N138 Other obstructive and reflux uropathy: Secondary | ICD-10-CM | POA: Diagnosis not present

## 2021-04-11 DIAGNOSIS — Z79899 Other long term (current) drug therapy: Secondary | ICD-10-CM | POA: Diagnosis not present

## 2021-04-11 DIAGNOSIS — Z9079 Acquired absence of other genital organ(s): Secondary | ICD-10-CM | POA: Diagnosis not present

## 2021-04-11 DIAGNOSIS — Z7901 Long term (current) use of anticoagulants: Secondary | ICD-10-CM | POA: Diagnosis not present

## 2021-04-13 ENCOUNTER — Other Ambulatory Visit: Payer: Self-pay

## 2021-04-13 ENCOUNTER — Ambulatory Visit (INDEPENDENT_AMBULATORY_CARE_PROVIDER_SITE_OTHER): Payer: Medicare Other | Admitting: Medical

## 2021-04-13 ENCOUNTER — Telehealth: Payer: Self-pay | Admitting: Pharmacist

## 2021-04-13 ENCOUNTER — Encounter: Payer: Self-pay | Admitting: Medical

## 2021-04-13 VITALS — BP 139/80 | HR 72 | Ht 70.0 in | Wt 174.8 lb

## 2021-04-13 DIAGNOSIS — I480 Paroxysmal atrial fibrillation: Secondary | ICD-10-CM

## 2021-04-13 DIAGNOSIS — I10 Essential (primary) hypertension: Secondary | ICD-10-CM | POA: Diagnosis not present

## 2021-04-13 DIAGNOSIS — Z01810 Encounter for preprocedural cardiovascular examination: Secondary | ICD-10-CM | POA: Diagnosis not present

## 2021-04-13 DIAGNOSIS — I2510 Atherosclerotic heart disease of native coronary artery without angina pectoris: Secondary | ICD-10-CM

## 2021-04-13 MED ORDER — POTASSIUM CHLORIDE ER 10 MEQ PO TBCR: 10.0000 meq | EXTENDED_RELEASE_TABLET | Freq: Every day | ORAL | 11 refills | Status: DC

## 2021-04-13 MED ORDER — AMLODIPINE BESYLATE 5 MG PO TABS: 7.5000 mg | ORAL_TABLET | Freq: Every day | ORAL | 11 refills | Status: AC

## 2021-04-13 MED ORDER — APIXABAN 2.5 MG PO TABS: 2.5000 mg | ORAL_TABLET | Freq: Two times a day (BID) | ORAL | 11 refills | Status: AC

## 2021-04-13 NOTE — Patient Instructions (Addendum)
Medication Instructions:  1.Start potassium 10 meq daily. 2.Increase amlodipine to 7.5 mg daily. 3.Continue Eliquis 2.5 mg twice daily. *If you need a refill on your cardiac medications before your next appointment, please call your pharmacy*   Lab Work: BMET in 2 weeks. If you have labs (blood work) drawn today and your tests are completely normal, you will receive your results only by: [place] (if you have MyChart) OR A paper copy in the mail If you have any lab test that is abnormal or we need to change your treatment, we will call you to review the results.   Testing/Procedures: None   Follow-Up: At [facility], you and your health needs are our priority.  As part of our continuing mission to provide you with exceptional heart care, we have created designated Provider Care Teams.  These Care Teams include your primary Cardiologist (physician) and Advanced Practice Providers (APPs -  Physician Assistants and Nurse Practitioners) who all work together to provide you with the care you need, when you need it.  We recommend signing up for the patient portal called "MyChart".  Sign up information is provided on this After Visit Summary.  MyChart is used to connect with patients for Virtual Visits (Telemedicine).  Patients are able to view lab/test results, encounter notes, upcoming appointments, etc.  Non-urgent messages can be sent to your provider as well.   To learn more about what you can do with MyChart, go to [email or web address].    Your next appointment:   6 month(s)  The format for your next appointment:   In Person  Provider:   K. Mali Hilty, MD   You have been cleared to hold your Eliquis for 3 days prior to your turp procedure.

## 2021-04-13 NOTE — Telephone Encounter (Signed)
Patient with diagnosis of Eliquis on a Fib for anticoagulation.    Procedure: TURP Date of procedure: 10/22   CHA2DS2-VASc Score = 4  his indicates a 4.8% annual risk of stroke. The patient's score is based upon: CHF History: No HTN History: Yes Diabetes History: No Stroke History: No Vascular Disease History: Yes Age Score: 2 Gender Score: 0   CrCl 53 mL/min Platelet count 108K   Per office protocol, patient can hold Eliquis for 3 days prior to procedure.

## 2021-04-16 ENCOUNTER — Telehealth: Payer: Self-pay

## 2021-04-16 NOTE — Telephone Encounter (Signed)
   [facility] Pre-operative Risk Assessment    Patient Name: Nicholas Wood  DOB: 1935-12-10 MRN: [id number]  HEARTCARE STAFF:  - IMPORTANT!!!!!! Under Visit Info/Reason for Call, type in Other and utilize the format Clearance MM/DD/YY or Clearance TBD. Do not use dashes or single digits. - Please review there is not already an duplicate clearance open for this procedure. - If request is for dental extraction, please clarify the # of teeth to be extracted. - If the patient is currently at the dentist's office, call Pre-Op Callback Staff (MA/nurse) to input urgent request.  - If the patient is not currently in the dentist office, please route to the Pre-Op pool.  Request for surgical clearance:  What type of surgery is being performed? Cytoscopy When is this surgery scheduled? 05/31/2021  What type of clearance is required (medical clearance vs. Pharmacy clearance to hold med vs. Both)? Pharmacy   Are there any medications that need to be held prior to surgery and how long? Eliquis  Practice name and name of physician performing surgery? [facility]  What is the office phone number? [phone]   7.   What is the office fax number? [phone]  8.   Anesthesia type (None, local, MAC, general) ? Unknown    Nicholas Wood 04/16/2021, 11:50 AM  _________________________________________________________________   (provider comments below)

## 2021-04-16 NOTE — Telephone Encounter (Addendum)
    Patient Name: Nicholas Wood  DOB: [birth year] MRN: [id number]  Primary Cardiologist: Pixie Casino, MD  Chart reviewed as part of pre-operative protocol coverage. Patient recently seen by Roby Lofts, PA-C, on 04/13/2021 at which time he mentioned need for urologic procedure at [facility].   Per Casimer Lanius note:  - He is anticipating upcoming TURP for management of LUTS with BPH. Following with Dr. Izell Peoria with urology at [facility]. He can easily complete 4 METs without anginal complaints - Based on ACC/AHA guidelines, RAINI RITTHALER would be at acceptable risk for the planned procedure without further cardiovascular testing.  - The patient was advised that if he develops new symptoms prior to surgery to contact our office to arrange for a follow-up visit, and he verbalized understanding. - Per pharmacy recommendations, patient can hold eliquis 3 days prior to his upcoming procedure. Eliquis should be restarted as soon as safely possible following procedure.  I will route this recommendation to the requesting party via Epic fax function and remove from pre-op pool.  Please call with questions.  Darreld Mclean, PA-C 04/16/2021, 12:23 PM

## 2021-04-27 DIAGNOSIS — G5601 Carpal tunnel syndrome, right upper limb: Secondary | ICD-10-CM | POA: Diagnosis not present

## 2021-04-30 DIAGNOSIS — I480 Paroxysmal atrial fibrillation: Secondary | ICD-10-CM | POA: Diagnosis not present

## 2021-04-30 DIAGNOSIS — I2510 Atherosclerotic heart disease of native coronary artery without angina pectoris: Secondary | ICD-10-CM | POA: Diagnosis not present

## 2021-04-30 DIAGNOSIS — Z23 Encounter for immunization: Secondary | ICD-10-CM | POA: Diagnosis not present

## 2021-04-30 DIAGNOSIS — I10 Essential (primary) hypertension: Secondary | ICD-10-CM | POA: Diagnosis not present

## 2021-05-01 DIAGNOSIS — Z08 Encounter for follow-up examination after completed treatment for malignant neoplasm: Secondary | ICD-10-CM | POA: Diagnosis not present

## 2021-05-01 DIAGNOSIS — C4362 Malignant melanoma of left upper limb, including shoulder: Secondary | ICD-10-CM | POA: Diagnosis not present

## 2021-05-01 DIAGNOSIS — D485 Neoplasm of uncertain behavior of skin: Secondary | ICD-10-CM | POA: Diagnosis not present

## 2021-05-01 DIAGNOSIS — Z85820 Personal history of malignant melanoma of skin: Secondary | ICD-10-CM | POA: Diagnosis not present

## 2021-05-01 DIAGNOSIS — C44629 Squamous cell carcinoma of skin of left upper limb, including shoulder: Secondary | ICD-10-CM | POA: Diagnosis not present

## 2021-05-01 DIAGNOSIS — R229 Localized swelling, mass and lump, unspecified: Secondary | ICD-10-CM | POA: Diagnosis not present

## 2021-05-01 LAB — BASIC METABOLIC PANEL
BUN/Creatinine Ratio: 14 (ref 10–24)
BUN: 18 mg/dL (ref 8–27)
CO2: 23 mmol/L (ref 20–29)
Calcium: 9.2 mg/dL (ref 8.6–10.2)
Chloride: 91 mmol/L — ABNORMAL LOW (ref 96–106)
Creatinine, Ser: 1.25 mg/dL (ref 0.76–1.27)
Glucose: 99 mg/dL (ref 65–99)
Potassium: 4.4 mmol/L (ref 3.5–5.2)
Sodium: 141 mmol/L (ref 134–144)
eGFR: 57 mL/min/{1.73_m2} — ABNORMAL LOW (ref >59)

## 2021-05-02 ENCOUNTER — Telehealth: Payer: Self-pay | Admitting: Medical

## 2021-05-02 NOTE — Telephone Encounter (Signed)
Pt updated with lab results and verbalized understanding.    Abigail Butts, PA-C  05/01/2021  7:52 PM EDT Back to Top    See MyChart message:   Hi Mr. Raikes - your blood work showed stable kidney function and electrolyte levels. You can continue with your current medications as prescribed. I hope you have a very happy birthday on Thursday!

## 2021-05-02 NOTE — Telephone Encounter (Signed)
Follow Up:     Patient wants to know if his lab results are ready from Monday(04-30-21)?

## 2021-05-09 DIAGNOSIS — C4441 Basal cell carcinoma of skin of scalp and neck: Secondary | ICD-10-CM | POA: Diagnosis not present

## 2021-05-09 DIAGNOSIS — C44319 Basal cell carcinoma of skin of other parts of face: Secondary | ICD-10-CM | POA: Diagnosis not present

## 2021-05-10 DIAGNOSIS — N312 Flaccid neuropathic bladder, not elsewhere classified: Secondary | ICD-10-CM | POA: Diagnosis not present

## 2021-05-10 DIAGNOSIS — N401 Enlarged prostate with lower urinary tract symptoms: Secondary | ICD-10-CM | POA: Diagnosis not present

## 2021-05-10 DIAGNOSIS — N138 Other obstructive and reflux uropathy: Secondary | ICD-10-CM | POA: Diagnosis not present

## 2021-05-10 DIAGNOSIS — N2889 Other specified disorders of kidney and ureter: Secondary | ICD-10-CM | POA: Diagnosis not present

## 2021-05-21 DIAGNOSIS — L7621 Postprocedural hemorrhage and hematoma of skin and subcutaneous tissue following a dermatologic procedure: Secondary | ICD-10-CM | POA: Diagnosis not present

## 2021-05-30 DIAGNOSIS — C44629 Squamous cell carcinoma of skin of left upper limb, including shoulder: Secondary | ICD-10-CM | POA: Diagnosis not present

## 2021-05-30 DIAGNOSIS — Z85820 Personal history of malignant melanoma of skin: Secondary | ICD-10-CM | POA: Diagnosis not present

## 2021-05-30 DIAGNOSIS — Z08 Encounter for follow-up examination after completed treatment for malignant neoplasm: Secondary | ICD-10-CM | POA: Diagnosis not present

## 2021-05-30 DIAGNOSIS — R229 Localized swelling, mass and lump, unspecified: Secondary | ICD-10-CM | POA: Diagnosis not present

## 2021-05-30 DIAGNOSIS — L570 Actinic keratosis: Secondary | ICD-10-CM | POA: Diagnosis not present

## 2021-05-30 DIAGNOSIS — L814 Other melanin hyperpigmentation: Secondary | ICD-10-CM | POA: Diagnosis not present

## 2021-05-30 DIAGNOSIS — Z85828 Personal history of other malignant neoplasm of skin: Secondary | ICD-10-CM | POA: Diagnosis not present

## 2021-05-30 DIAGNOSIS — D485 Neoplasm of uncertain behavior of skin: Secondary | ICD-10-CM | POA: Diagnosis not present

## 2021-05-30 DIAGNOSIS — C4441 Basal cell carcinoma of skin of scalp and neck: Secondary | ICD-10-CM | POA: Diagnosis not present

## 2021-05-30 DIAGNOSIS — L578 Other skin changes due to chronic exposure to nonionizing radiation: Secondary | ICD-10-CM | POA: Diagnosis not present

## 2021-05-30 DIAGNOSIS — C44311 Basal cell carcinoma of skin of nose: Secondary | ICD-10-CM | POA: Diagnosis not present

## 2021-06-07 DIAGNOSIS — N529 Male erectile dysfunction, unspecified: Secondary | ICD-10-CM | POA: Diagnosis not present

## 2021-06-07 DIAGNOSIS — M25531 Pain in right wrist: Secondary | ICD-10-CM | POA: Diagnosis not present

## 2021-06-07 DIAGNOSIS — I10 Essential (primary) hypertension: Secondary | ICD-10-CM | POA: Diagnosis not present

## 2021-06-12 DIAGNOSIS — Z23 Encounter for immunization: Secondary | ICD-10-CM | POA: Diagnosis not present

## 2021-06-14 ENCOUNTER — Encounter: Attending: Urology

## 2021-06-14 ENCOUNTER — Ambulatory Visit (INDEPENDENT_AMBULATORY_CARE_PROVIDER_SITE_OTHER): Payer: Medicare Other | Admitting: Sports Medicine

## 2021-06-14 VITALS — BP 140/82 | Ht 70.0 in | Wt 174.0 lb

## 2021-06-14 DIAGNOSIS — I2510 Atherosclerotic heart disease of native coronary artery without angina pectoris: Secondary | ICD-10-CM | POA: Diagnosis not present

## 2021-06-14 DIAGNOSIS — M25531 Pain in right wrist: Secondary | ICD-10-CM | POA: Diagnosis not present

## 2021-06-14 DIAGNOSIS — I10 Essential (primary) hypertension: Secondary | ICD-10-CM | POA: Diagnosis not present

## 2021-06-14 NOTE — Progress Notes (Signed)
Subjective:    Patient ID: Nicholas Wood, male    DOB: 1936/04/01, 85 y.o.   MRN: [id number]  HPI chief complaint: Right wrist pain  Nicholas Wood is a very pleasant right-hand-dominant 85 year old male that comes in today complaining of dorsal right wrist pain.  His pain all began suddenly in April.  He was out of town when he had a sudden onset of painful swelling across the dorsum of his wrist into the distal forearm.  He was seen at a local emergency room and diagnosed with cellulitis.  He was placed on Keflex which did resolve the swelling.  He was also told that it may be gout.  When he continued to have symptoms, he was seen at [place].  In addition to his pain he was experiencing numbness and tingling into the right hand.  An EMG/nerve conduction study confirmed moderately severe carpal tunnel syndrome for which he underwent carpal tunnel release with Dr. Grandville Silos.  Unfortunately, this did not help with the pain he is experiencing in the dorsum of his wrist.  He enjoys golf but has been unable to return to playing due to his pain being so severe.  Nicholas Wood explains to me that he was told that the bones in his wrist had "moved" and that his pain may be due to to that.  He has tried topical Voltaren which has not been helpful.  Over-the-counter Aleve is extremely helpful but he is concerned about taking it because he is on Eliquis.  He does still endorse some numbness along the median nerve distribution of his right hand but most of his pain is on the dorsum of the hand and wrist.  Past medical history reviewed Medications reviewed Allergies reviewed    Review of Systems As above    Objective:   Physical Exam  Well-developed, well-nourished.  No acute distress  Right wrist: Patient lacks active range of motion in all planes, especially with radial and ulnar deviation.  There is no obvious soft tissue swelling.  He is tender to palpation diffusely across the dorsum of the wrist.  He has a  well-healed surgical incision on the volar aspect of the wrist consistent with his recent carpal tunnel release.  I do not appreciate any significant atrophy in the hand.  Negative Finkelstein's.  No tenderness to palpation over the TFCC.  Good pulses.  Decreased sensation to light touch along the median nerve distribution in the palm of the hand.      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  I have asked Nicholas Wood to acquire a copy of his x-rays from [place] for me to review.  I have also requested the records from [place].  Since Aleve does seem to be extremely helpful, I have asked him to discuss this with his cardiologist.  Nicholas Wood enjoys playing golf twice a week and it would be nice if he were able to take a single dose of Aleve 2 hours prior to playing to see if that would be helpful.  I also think a compression sleeve with golf is worth trying.  He will follow-up with me again in 3 to 4 weeks for reevaluation.  We will hopefully give me a chance to review his records  as well as his x-rays and see how he responds to today's treatment.  This note was dictated using Dragon naturally speaking software and may contain errors in syntax, spelling, or content which have not been identified prior to signing this note.

## 2021-06-14 NOTE — Patient Instructions (Signed)
It was nice meeting you today.  I would like for you to go to [place] and ask them to give you a copy of your wrist x-rays on a disc.  You can then drop them off with Threasa Beards so that I can review them.  I will request the records from [facility] orthopedics as well.  Talk with your cardiologist and see if they are okay with you taking a single dose of Aleve twice a week 2 hours before you play golf.  You may find benefit in wearing a compression sleeve when playing golf as well.  Follow-up with me again in 3 to 4 weeks.  This will give me time to gather the records from [place], you to get me a copy of your x-rays, and see if the Aleve is helping (as long as you get the blessing from your cardiologist).

## 2021-06-19 DIAGNOSIS — M1990 Unspecified osteoarthritis, unspecified site: Secondary | ICD-10-CM | POA: Diagnosis not present

## 2021-06-19 DIAGNOSIS — M25531 Pain in right wrist: Secondary | ICD-10-CM | POA: Diagnosis not present

## 2021-06-19 DIAGNOSIS — M79642 Pain in left hand: Secondary | ICD-10-CM | POA: Diagnosis not present

## 2021-06-19 DIAGNOSIS — M79641 Pain in right hand: Secondary | ICD-10-CM | POA: Diagnosis not present

## 2021-06-19 DIAGNOSIS — I10 Essential (primary) hypertension: Secondary | ICD-10-CM | POA: Diagnosis not present

## 2021-06-19 DIAGNOSIS — F339 Major depressive disorder, recurrent, unspecified: Secondary | ICD-10-CM | POA: Diagnosis not present

## 2021-06-19 DIAGNOSIS — M25431 Effusion, right wrist: Secondary | ICD-10-CM | POA: Diagnosis not present

## 2021-07-03 ENCOUNTER — Ambulatory Visit: Payer: Medicare Other | Admitting: Sports Medicine

## 2021-07-05 ENCOUNTER — Ambulatory Visit (INDEPENDENT_AMBULATORY_CARE_PROVIDER_SITE_OTHER): Payer: Medicare Other | Admitting: Sports Medicine

## 2021-07-05 ENCOUNTER — Encounter: Payer: Self-pay | Admitting: Sports Medicine

## 2021-07-05 VITALS — BP 130/80 | Ht 70.0 in | Wt 174.0 lb

## 2021-07-05 DIAGNOSIS — C44311 Basal cell carcinoma of skin of nose: Secondary | ICD-10-CM | POA: Diagnosis not present

## 2021-07-05 DIAGNOSIS — I2510 Atherosclerotic heart disease of native coronary artery without angina pectoris: Secondary | ICD-10-CM | POA: Diagnosis not present

## 2021-07-05 DIAGNOSIS — M19131 Post-traumatic osteoarthritis, right wrist: Secondary | ICD-10-CM

## 2021-07-05 NOTE — Progress Notes (Signed)
Patient ID: Nicholas Wood, male   DOB: [birth year], 85 y.o.   MRN: [id number]  [name] presents today for follow-up on his right wrist pain.  I was able to review the notes from Dr. Grandville Silos as well as recent x-rays of his wrist.  [name] does not fact have a SLAC wrist.  Review of the notes shows that he has had it injected with only minimal pain relief.  He would really like to return to golf.  I asked him if he had discussed with his cardiologist taking some Aleve a couple of hours prior to playing but it does not sound like he has done this.  Physical exam was not repeated.  We simply talked about his ongoing risk dilemma.  I recommended that he return to Dr. Grandville Silos to discuss further treatment.  Follow-up with me as needed.

## 2021-07-05 NOTE — Patient Instructions (Signed)
  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  I recommend that you see Dr. Grandville Silos again to discuss your options.  In the meantime, if it is okay with your cardiologist, I would suggest that you try taking a dose of your over-the-counter anti-inflammatory 1 to 2 hours prior to golfing.

## 2021-07-18 DIAGNOSIS — Z23 Encounter for immunization: Secondary | ICD-10-CM | POA: Diagnosis not present

## 2021-07-18 DIAGNOSIS — N182 Chronic kidney disease, stage 2 (mild): Secondary | ICD-10-CM | POA: Diagnosis not present

## 2021-07-25 ENCOUNTER — Encounter: Attending: Urology

## 2021-08-07 DIAGNOSIS — M25531 Pain in right wrist: Secondary | ICD-10-CM | POA: Diagnosis not present

## 2021-08-31 DIAGNOSIS — I10 Essential (primary) hypertension: Secondary | ICD-10-CM | POA: Diagnosis not present

## 2021-08-31 DIAGNOSIS — Z0000 Encounter for general adult medical examination without abnormal findings: Secondary | ICD-10-CM | POA: Diagnosis not present

## 2021-09-04 DIAGNOSIS — N401 Enlarged prostate with lower urinary tract symptoms: Secondary | ICD-10-CM | POA: Diagnosis not present

## 2021-09-04 DIAGNOSIS — D510 Vitamin B12 deficiency anemia due to intrinsic factor deficiency: Secondary | ICD-10-CM | POA: Diagnosis not present

## 2021-09-04 DIAGNOSIS — Z0000 Encounter for general adult medical examination without abnormal findings: Secondary | ICD-10-CM | POA: Diagnosis not present

## 2021-09-04 DIAGNOSIS — D72819 Decreased white blood cell count, unspecified: Secondary | ICD-10-CM | POA: Diagnosis not present

## 2021-09-04 DIAGNOSIS — I480 Paroxysmal atrial fibrillation: Secondary | ICD-10-CM | POA: Diagnosis not present

## 2021-09-04 DIAGNOSIS — I10 Essential (primary) hypertension: Secondary | ICD-10-CM | POA: Diagnosis not present

## 2021-09-04 DIAGNOSIS — D6869 Other thrombophilia: Secondary | ICD-10-CM | POA: Diagnosis not present

## 2021-09-04 DIAGNOSIS — I2510 Atherosclerotic heart disease of native coronary artery without angina pectoris: Secondary | ICD-10-CM | POA: Diagnosis not present

## 2021-09-04 DIAGNOSIS — N1831 Chronic kidney disease, stage 3a: Secondary | ICD-10-CM | POA: Diagnosis not present

## 2021-09-04 DIAGNOSIS — Z9889 Other specified postprocedural states: Secondary | ICD-10-CM | POA: Diagnosis not present

## 2021-09-04 DIAGNOSIS — N5201 Erectile dysfunction due to arterial insufficiency: Secondary | ICD-10-CM | POA: Diagnosis not present

## 2021-09-04 DIAGNOSIS — D696 Thrombocytopenia, unspecified: Secondary | ICD-10-CM | POA: Diagnosis not present

## 2021-09-12 DIAGNOSIS — Z85828 Personal history of other malignant neoplasm of skin: Secondary | ICD-10-CM | POA: Diagnosis not present

## 2021-09-12 DIAGNOSIS — R229 Localized swelling, mass and lump, unspecified: Secondary | ICD-10-CM | POA: Diagnosis not present

## 2021-09-12 DIAGNOSIS — Z85820 Personal history of malignant melanoma of skin: Secondary | ICD-10-CM | POA: Diagnosis not present

## 2021-09-12 DIAGNOSIS — D2271 Melanocytic nevi of right lower limb, including hip: Secondary | ICD-10-CM | POA: Diagnosis not present

## 2021-09-12 DIAGNOSIS — D485 Neoplasm of uncertain behavior of skin: Secondary | ICD-10-CM | POA: Diagnosis not present

## 2021-09-12 DIAGNOSIS — C44329 Squamous cell carcinoma of skin of other parts of face: Secondary | ICD-10-CM | POA: Diagnosis not present

## 2021-09-12 DIAGNOSIS — Z08 Encounter for follow-up examination after completed treatment for malignant neoplasm: Secondary | ICD-10-CM | POA: Diagnosis not present

## 2021-09-12 DIAGNOSIS — C44629 Squamous cell carcinoma of skin of left upper limb, including shoulder: Secondary | ICD-10-CM | POA: Diagnosis not present

## 2021-09-12 DIAGNOSIS — C4442 Squamous cell carcinoma of skin of scalp and neck: Secondary | ICD-10-CM | POA: Diagnosis not present

## 2021-09-12 DIAGNOSIS — C44321 Squamous cell carcinoma of skin of nose: Secondary | ICD-10-CM | POA: Diagnosis not present

## 2021-09-12 DIAGNOSIS — C4362 Malignant melanoma of left upper limb, including shoulder: Secondary | ICD-10-CM | POA: Diagnosis not present

## 2021-09-12 DIAGNOSIS — L570 Actinic keratosis: Secondary | ICD-10-CM | POA: Diagnosis not present

## 2021-09-26 ENCOUNTER — Telehealth: Payer: Self-pay | Admitting: Internal Medicine

## 2021-09-26 NOTE — Telephone Encounter (Signed)
Pt c/o medication issue:  1. Name of Medication: apixaban (ELIQUIS) 2.5 MG TABS tablet  2. How are you currently taking this medication (dosage and times per day)? Take 1 tablet (2.5 mg total) by mouth 2 (two) times daily.  3. Are you having a reaction (difficulty breathing--STAT)? no  4. What is your medication issue? pt is only taken a half of tab of eliquis once daily because he thinks his platelet levels are too low... pcp calling to see what Dr. Tinnie Gens

## 2021-09-26 NOTE — Telephone Encounter (Signed)
PharmD, Naida Sleight, from pt primary office calling to verify how patient is supposed to take his Eliquis. Patient is ordered to 2.5mg  twice daily and should be taking this dosage, at minimum daily.  Left this message with Doroteo Bradford as Naida Sleight was unable to be reach at this time.

## 2021-10-08 DIAGNOSIS — N2889 Other specified disorders of kidney and ureter: Secondary | ICD-10-CM | POA: Diagnosis not present

## 2021-10-16 DIAGNOSIS — I10 Essential (primary) hypertension: Secondary | ICD-10-CM | POA: Diagnosis not present

## 2021-10-16 DIAGNOSIS — I2510 Atherosclerotic heart disease of native coronary artery without angina pectoris: Secondary | ICD-10-CM | POA: Diagnosis not present

## 2021-10-16 DIAGNOSIS — M1990 Unspecified osteoarthritis, unspecified site: Secondary | ICD-10-CM | POA: Diagnosis not present

## 2021-10-16 DIAGNOSIS — E876 Hypokalemia: Secondary | ICD-10-CM | POA: Diagnosis not present

## 2021-10-24 DIAGNOSIS — Z96661 Presence of right artificial ankle joint: Secondary | ICD-10-CM | POA: Diagnosis not present

## 2021-10-24 DIAGNOSIS — M216X1 Other acquired deformities of right foot: Secondary | ICD-10-CM | POA: Diagnosis not present

## 2021-10-24 DIAGNOSIS — M19071 Primary osteoarthritis, right ankle and foot: Secondary | ICD-10-CM | POA: Diagnosis not present

## 2021-10-24 DIAGNOSIS — M7731 Calcaneal spur, right foot: Secondary | ICD-10-CM | POA: Diagnosis not present

## 2021-11-14 DIAGNOSIS — D485 Neoplasm of uncertain behavior of skin: Secondary | ICD-10-CM | POA: Diagnosis not present

## 2021-11-14 DIAGNOSIS — L905 Scar conditions and fibrosis of skin: Secondary | ICD-10-CM | POA: Diagnosis not present

## 2021-11-21 DIAGNOSIS — I10 Essential (primary) hypertension: Secondary | ICD-10-CM | POA: Diagnosis not present

## 2021-11-21 DIAGNOSIS — D72819 Decreased white blood cell count, unspecified: Secondary | ICD-10-CM | POA: Diagnosis not present

## 2021-11-22 DIAGNOSIS — I2510 Atherosclerotic heart disease of native coronary artery without angina pectoris: Secondary | ICD-10-CM | POA: Diagnosis not present

## 2021-11-22 DIAGNOSIS — I10 Essential (primary) hypertension: Secondary | ICD-10-CM | POA: Diagnosis not present

## 2021-12-11 DIAGNOSIS — J0140 Acute pansinusitis, unspecified: Secondary | ICD-10-CM | POA: Diagnosis not present

## 2021-12-12 DIAGNOSIS — J0140 Acute pansinusitis, unspecified: Secondary | ICD-10-CM | POA: Diagnosis not present

## 2021-12-12 DIAGNOSIS — R040 Epistaxis: Secondary | ICD-10-CM | POA: Diagnosis not present

## 2021-12-16 DIAGNOSIS — E876 Hypokalemia: Secondary | ICD-10-CM | POA: Diagnosis not present

## 2021-12-16 DIAGNOSIS — M1990 Unspecified osteoarthritis, unspecified site: Secondary | ICD-10-CM | POA: Diagnosis not present

## 2021-12-16 DIAGNOSIS — I10 Essential (primary) hypertension: Secondary | ICD-10-CM | POA: Diagnosis not present

## 2021-12-16 DIAGNOSIS — I2510 Atherosclerotic heart disease of native coronary artery without angina pectoris: Secondary | ICD-10-CM | POA: Diagnosis not present

## 2021-12-31 DIAGNOSIS — N2889 Other specified disorders of kidney and ureter: Secondary | ICD-10-CM | POA: Diagnosis not present

## 2021-12-31 DIAGNOSIS — N401 Enlarged prostate with lower urinary tract symptoms: Secondary | ICD-10-CM | POA: Diagnosis not present

## 2021-12-31 DIAGNOSIS — R3912 Poor urinary stream: Secondary | ICD-10-CM | POA: Diagnosis not present

## 2022-01-01 DIAGNOSIS — I10 Essential (primary) hypertension: Secondary | ICD-10-CM | POA: Diagnosis not present

## 2022-01-03 DIAGNOSIS — I2510 Atherosclerotic heart disease of native coronary artery without angina pectoris: Secondary | ICD-10-CM | POA: Diagnosis not present

## 2022-01-03 DIAGNOSIS — I10 Essential (primary) hypertension: Secondary | ICD-10-CM | POA: Diagnosis not present

## 2022-01-16 ENCOUNTER — Other Ambulatory Visit: Payer: Self-pay | Admitting: Internal Medicine

## 2022-01-22 DIAGNOSIS — Z85828 Personal history of other malignant neoplasm of skin: Secondary | ICD-10-CM | POA: Diagnosis not present

## 2022-01-22 DIAGNOSIS — Z08 Encounter for follow-up examination after completed treatment for malignant neoplasm: Secondary | ICD-10-CM | POA: Diagnosis not present

## 2022-01-22 DIAGNOSIS — Z85820 Personal history of malignant melanoma of skin: Secondary | ICD-10-CM | POA: Diagnosis not present

## 2022-01-22 DIAGNOSIS — L821 Other seborrheic keratosis: Secondary | ICD-10-CM | POA: Diagnosis not present

## 2022-01-22 DIAGNOSIS — C4362 Malignant melanoma of left upper limb, including shoulder: Secondary | ICD-10-CM | POA: Diagnosis not present

## 2022-01-22 DIAGNOSIS — C44329 Squamous cell carcinoma of skin of other parts of face: Secondary | ICD-10-CM | POA: Diagnosis not present

## 2022-01-22 DIAGNOSIS — L570 Actinic keratosis: Secondary | ICD-10-CM | POA: Diagnosis not present

## 2022-01-22 DIAGNOSIS — R229 Localized swelling, mass and lump, unspecified: Secondary | ICD-10-CM | POA: Diagnosis not present

## 2022-01-22 DIAGNOSIS — L578 Other skin changes due to chronic exposure to nonionizing radiation: Secondary | ICD-10-CM | POA: Diagnosis not present

## 2022-01-22 DIAGNOSIS — D485 Neoplasm of uncertain behavior of skin: Secondary | ICD-10-CM | POA: Diagnosis not present

## 2022-01-22 DIAGNOSIS — D1801 Hemangioma of skin and subcutaneous tissue: Secondary | ICD-10-CM | POA: Diagnosis not present

## 2022-02-15 DIAGNOSIS — M1990 Unspecified osteoarthritis, unspecified site: Secondary | ICD-10-CM | POA: Diagnosis not present

## 2022-02-15 DIAGNOSIS — E876 Hypokalemia: Secondary | ICD-10-CM | POA: Diagnosis not present

## 2022-02-15 DIAGNOSIS — I2510 Atherosclerotic heart disease of native coronary artery without angina pectoris: Secondary | ICD-10-CM | POA: Diagnosis not present

## 2022-02-15 DIAGNOSIS — I10 Essential (primary) hypertension: Secondary | ICD-10-CM | POA: Diagnosis not present

## 2022-02-21 ENCOUNTER — Telehealth: Payer: Self-pay | Admitting: Student

## 2022-02-21 DIAGNOSIS — Z7901 Long term (current) use of anticoagulants: Secondary | ICD-10-CM | POA: Diagnosis not present

## 2022-02-21 DIAGNOSIS — I10 Essential (primary) hypertension: Secondary | ICD-10-CM | POA: Diagnosis not present

## 2022-02-21 NOTE — Telephone Encounter (Signed)
   Patient called Answering Service with concerns that he is back in atrial fibrillation. Called and spoke with patient. He states that over the last 12 hours, his [employer] has indicated that he is in an irregular rhythm (possible atrial fibrillation). He does feel his heart beating irregularly but no heart racing. No chest pain, shortness of breath, dizziness, or near syncope/syncope. Heart rates in the 70s. He is already on Eliquis and Coreg. I was able to get patient an office visit tomorrow at 8:50am with Jory Sims, NP. Advised patient that if he was to develop any concerning symptoms (new chest pain, significant shortness of breath, significant dizziness, syncope) before tomorrow, then he should go to the ED. Patient voiced understanding and was appreciative of the call.   I will route this note to Curt Bears so she is aware.  Darreld Mclean, PA-C 02/21/2022 7:54 AM

## 2022-02-21 NOTE — Progress Notes (Signed)
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 He has been on Multaq in the past but developed a drug-rash and since has been maintained on flecainide.  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 He does feel his heart beating irregularly but no heart racing.  He reports that this has been going on approximately 1 week.  He has had recent labs by his primary care provider which revealed no evidence of anemia, his platelets 111, potassium was 4.1, sodium 143, liver enzymes were normal, GFR 55, he also had cholesterol studies completed total cholesterol 147 HDL 40 LDL 98.  He denies associated dizziness, but he is having generalized fatigue which has been worsening over the last several months.  He denies any chest pain or shortness of breath associated with irregular heart rhythm.  He is medically compliant.  Home Medications    Current Outpatient Medications  Medication Sig Dispense Refill   ALPRAZolam (XANAX) 0.5 MG tablet Take 0.5 mg by mouth daily as  needed.     amLODipine (NORVASC) 2.5 MG tablet Take 2.5 mg by mouth daily. Take 1 Tablet Daily.     amLODipine (NORVASC) 5 MG tablet Take 1.5 tablets (7.5 mg total) by mouth daily. 45 tablet 11   apixaban (ELIQUIS) 2.5 MG TABS tablet Take 1 tablet (2.5 mg total) by mouth 2 (two) times daily. 60 tablet 11   Azilsartan Medoxomil (EDARBI) 80 MG TABS Take 40 mg by mouth daily.     buPROPion (WELLBUTRIN XL) 150 MG 24 hr tablet Take 150 mg by mouth daily.     carvedilol (COREG) 6.25 MG tablet Take 1 tablet (6.25 mg total) by mouth 2 (two) times daily. 180 tablet 3   Cholecalciferol 25 MCG (1000 UT) tablet Take by mouth.     clonazePAM (KLONOPIN) 0.5 MG tablet Take 1-2 mg by mouth daily.     cyanocobalamin (,VITAMIN B-12,) 1000 MCG/ML injection Inject 1,000 mcg into the muscle every 30 (thirty) days.     potassium chloride (KLOR-CON) 10 MEQ tablet Take 1 tablet (10 mEq total) by mouth daily. 30 tablet 11   tamsulosin (FLOMAX) 0.4 MG CAPS capsule Take by mouth.     triamterene (DYRENIUM) 100 MG capsule Take by mouth.     zaleplon (SONATA) 5 MG capsule Take 5 mg by mouth at bedtime.     Zolpidem Tartrate (AMBIEN PO) Take 1 tablet by mouth as needed (for sleep).     No current facility-administered medications for this visit.     Family  History    Family History  Problem Relation Age of Onset   Heart failure Father    Alzheimer's disease Mother    Diabetes Daughter        Obesity   He indicated that his mother is deceased. He indicated that his father is deceased. He indicated that his brother is alive. He indicated that his maternal grandmother is deceased. He indicated that his maternal grandfather is deceased. He indicated that his paternal grandmother is deceased. He indicated that his paternal grandfather is deceased. He indicated that only one of his three daughters is alive. He indicated that his son is alive.  Social History    Social History   Socioeconomic History   Marital status:  Married    Spouse name: Not on file   Number of children: 3   Years of education: Not on file   Highest education level: Not on file  Occupational History   Occupation: Field seismologist  Tobacco Use   Smoking status: Never   Smokeless tobacco: Never  Substance and Sexual Activity   Alcohol use: Yes    Alcohol/week: 0.0 standard drinks of alcohol    Comment: Occasional   Drug use: No   Sexual activity: Not on file  Other Topics Concern   Not on file  Social History Narrative   Consultant   Social Determinants of Health   Financial Resource Strain: Not on file  Food Insecurity: Not on file  Transportation Needs: Not on file  Physical Activity: Not on file  Stress: Not on file  Social Connections: Not on file  Intimate Partner Violence: Not on file     Review of Systems    General:  No chills, fever, night sweats or weight changes.  Cardiovascular:  No chest pain, dyspnea on exertion, edema, orthopnea, positive for palpitations, negative for paroxysmal nocturnal dyspnea. Dermatological: No rash, lesions/masses Respiratory: No cough, dyspnea Urologic: No hematuria, dysuria Abdominal:   No nausea, vomiting, diarrhea, bright red blood per rectum, melena, or hematemesis Neurologic:  No visual changes, wkns, changes in mental status. All other systems reviewed and are otherwise negative except as noted above.     Physical Exam    VS:  BP 124/72   Pulse 74   Ht '5\' 10"'$  (1.778 m)   Wt 183 lb 12.8 oz (83.4 kg)   SpO2 97%   BMI 26.37 kg/m  , BMI Body mass index is 26.37 kg/m.     GEN: Well nourished, well developed, in no acute distress. HEENT: normal. Neck: Supple, no JVD, carotid bruits, or masses. Cardiac: Irregular rate and rhythm, soft systolic murmurs, rubs, or gallops. No clubbing, cyanosis, edema.  Radials/DP/PT 2+ and equal bilaterally.  Respiratory:  Respirations regular and unlabored, clear to auscultation bilaterally. GI: Soft, nontender, nondistended,  BS + x 4. MS: no deformity or atrophy. Skin: warm and dry, no rash. Neuro:  Strength and sensation are intact. Psych: Normal affect.  Accessory Clinical Findings    ECG personally reviewed by me today-sinus rhythm with first-degree AV block, PR interval 226 ms, frequent PVCs, bigeminy, heart rate 74 bpm-compared to prior EKG which revealed sinus bradycardia only  Lab Results  Component Value Date   WBC 4.6 05/24/2015   HGB 14.6 05/24/2015   HCT 43.3 05/24/2015   MCV 93.1 05/24/2015   PLT 129 (L) 05/24/2015   Lab Results  Component Value Date   CREATININE 1.25 04/30/2021   BUN 18 04/30/2021   NA 141 04/30/2021   K  4.4 04/30/2021   CL 91 (L) 04/30/2021   CO2 23 04/30/2021   Lab Results  Component Value Date   ALT 14 (L) 05/24/2015   AST 18 05/24/2015   ALKPHOS 84 05/24/2015   BILITOT 0.7 05/24/2015   No results found for: "CHOL", "HDL", "LDLCALC", "LDLDIRECT", "TRIG", "CHOLHDL"  No results found for: "HGBA1C"  Review of Prior Studies:  Calcium score 08/2020: IMPRESSION:  1.  Total calcium score of 157 is between the 0 and 25 percentile for males above the age of 31.  2.  Definite, at least moderate atherosclerotic plaque. Risk of coronary artery disease: Mild coronary artery disease highly likely, significant narrowings possible.    Echocardiogram 2019:  INTERPRETATION ---------------------------------------------------------------    MILD LV DYSFUNCTION (See above) WITH MILD LVH    NORMAL RIGHT VENTRICULAR SYSTOLIC FUNCTION    VALVULAR REGURGITATION: TRIVIAL MR, MILD PR, TRIVIAL TR    NO VALVULAR STENOSIS    INTERATRIAL SHUNT SEEN BY COLOR AND SPECTRAL DOPPLER    S/P ABLATION; IRREGULAR CARDIAC RHYTHM THROUGHOUT EXAM    NO PRIOR STUDY FOR COMPARISON    3D acquisition and reconstructions were performed as part of this    examination to more accurately quantify the effects of reduced left    ventricular ejection fraction. (post-processing on an Independent     workstation).    Assessment & Plan   1.  Frequent PVCs: Noted on EKG some bigeminy.  Patient is aware of this especially over the last week, with increasing fatigue.  He is on carvedilol 3.125 mg twice daily.  I will increase this to 6.25 mg twice daily, will reduce amlodipine from 5 mg daily to 2.5 mg daily to avoid hypotension.  Would like to see him on follow-up in a couple of weeks but he would like to wait about 3 weeks.  He knows to call us sooner if he becomes more symptomatic with worsening palpitations, or dizziness, or hypotension.  We will not do any labs as he had some recently completed by PCP as discussed above.  2.  Paroxysmal atrial flutter no evidence of atrial fibrillation on EKG today.  He is status post ablation.  Followed by EP.  Continue Eliquis twice daily at 2.5 mg daily.  3.  Hypertension: Blood pressure currently well controlled on amlodipine however I will decrease dose to 2.5 mg daily from 5 mg daily as I am increasing his carvedilol to 6.25 mg twice daily.  He is to keep track of his blood pressure, heart rate, and report any new or worsening symptoms.      Current medicines are reviewed at length with the patient today.  I have spent 30 min's  dedicated to the care of this patient on the date of this encounter to include pre-visit review of records, assessment, management and diagnostic testing,with shared decision making. Signed, Phill Myron. West Pugh, ANP, AACC   02/22/2022 10:05 AM    [facility] [place] [address] Office [phone] Fax [phone]  Notice: This dictation was prepared with Dragon dictation along with smaller phrase technology. Any transcriptional errors that result from this process are unintentional and may not be corrected upon review.

## 2022-02-22 ENCOUNTER — Ambulatory Visit (INDEPENDENT_AMBULATORY_CARE_PROVIDER_SITE_OTHER): Payer: Medicare Other | Admitting: Adult Health

## 2022-02-22 ENCOUNTER — Encounter: Payer: Self-pay | Admitting: Adult Health

## 2022-02-22 VITALS — BP 124/72 | HR 74 | Ht 70.0 in | Wt 183.8 lb

## 2022-02-22 DIAGNOSIS — Z9889 Other specified postprocedural states: Secondary | ICD-10-CM

## 2022-02-22 DIAGNOSIS — I4892 Unspecified atrial flutter: Secondary | ICD-10-CM

## 2022-02-22 DIAGNOSIS — Z8679 Personal history of other diseases of the circulatory system: Secondary | ICD-10-CM | POA: Diagnosis not present

## 2022-02-22 DIAGNOSIS — I10 Essential (primary) hypertension: Secondary | ICD-10-CM | POA: Diagnosis not present

## 2022-02-22 DIAGNOSIS — I493 Ventricular premature depolarization: Secondary | ICD-10-CM | POA: Diagnosis not present

## 2022-02-22 MED ORDER — CARVEDILOL 6.25 MG PO TABS: 6.2500 mg | ORAL_TABLET | Freq: Two times a day (BID) | ORAL | 3 refills | Status: DC

## 2022-02-22 NOTE — Patient Instructions (Addendum)
Medication Instructions:  Increase Coreg to 6.25 mg ( Take 1 Tablet Twice Daily). Decrease Amlodipine to 2.5 mg ( Take 1 Tablet Daily). *If you need a refill on your cardiac medications before your next appointment, please call your pharmacy*   Lab Work: No Labs If you have labs (blood work) drawn today and your tests are completely normal, you will receive your results only by: [place] (if you have MyChart) OR A paper copy in the mail If you have any lab test that is abnormal or we need to change your treatment, we will call you to review the results.   Testing/Procedures: No Testing   Follow-Up: At [facility], you and your health needs are our priority.  As part of our continuing mission to provide you with exceptional heart care, we have created designated Provider Care Teams.  These Care Teams include your primary Cardiologist (physician) and Advanced Practice Providers (APPs -  Physician Assistants and Nurse Practitioners) who all work together to provide you with the care you need, when you need it.  We recommend signing up for the patient portal called "MyChart".  Sign up information is provided on this After Visit Summary.  MyChart is used to connect with patients for Virtual Visits (Telemedicine).  Patients are able to view lab/test results, encounter notes, upcoming appointments, etc.  Non-urgent messages can be sent to your provider as well.   To learn more about what you can do with MyChart, go to [email or web address].    Your next appointment:   3 week(s)  The format for your next appointment:   In Person  Provider:   Jory Sims, DNP, ANP    Then, Pixie Casino, MD will plan to see you again in 6 month(s).      Important Information About Sugar

## 2022-02-26 DIAGNOSIS — L570 Actinic keratosis: Secondary | ICD-10-CM | POA: Diagnosis not present

## 2022-02-26 DIAGNOSIS — B009 Herpesviral infection, unspecified: Secondary | ICD-10-CM | POA: Diagnosis not present

## 2022-03-14 NOTE — Progress Notes (Signed)
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  He was originally diagnosed with atrial fibrillation in 2006 and had been followed by [facility] electrophysiology with cardioversion and ablation in 2019.  He was unable to tolerate Multaq as he had a drug rash and has been maintained on flecainide.    Calcium CTA score study in 2022 revealed a score of 157 and suspected moderate atherosclerotic plaque and at least mild CAD., He was doing well, walking 2 miles a day and going up stairs numerous times a day without any anginal symptoms.  He occasionally has dyspnea on exertion when ambulating up several flights of stairs but quickly recovers.    He was last seen in the office on 02/22/2022 with complaints of irregular heart rhythm.  EKG revealed frequent PVCs.  Carvedilol was increased to 6.25 mg twice daily and amlodipine was decreased to 2.5 mg daily to avoid hypotension.  EKG did not reveal any evidence of atrial fibrillation or flutter  Past Medical History    Past Medical History:  Diagnosis Date   Atrial flutter (HCC)    Hypertension    Paroxysmal atrial fibrillation ([place])    Past Surgical History:  Procedure Laterality Date   TONSILLECTOMY     TOTAL ANKLE REPLACEMENT     TRANSURETHRAL RESECTION OF PROSTATE  07/01/2002   Benign prostatic hypertrophy with bladder outlet obstruction    Allergies  Allergies  Allergen Reactions   Dronedarone Rash   Sulfa Antibiotics Rash    History of Present Illness    Mr. Vanhise presents today after calling our office stating that his cardio mobile indicated that his heart rate was irregular.   He was last seen in the office on 02/22/2022 with EKG revealing frequent PVCs and no evidence of atrial fib or flutter.  On that office visit carvedilol was increased to 6.25 mg twice daily and amlodipine was decreased to 2.5 mg daily to avoid hypotension.  He was to continue Eliquis twice daily at 2.5 mg.  He is here to discuss his response to medications.  He brings with him a list of his blood pressures which are excellently controlled running between 826 and 415 systolic.  Heart rate is been running in the 60s.  70s.  He denies any positional dizziness, chest discomfort, or continuation of palpitations.  He feels that they have been resolved.  He offers no other complaints today and is pleased with his current status on the medications.  Home Medications    Current Outpatient Medications  Medication Sig Dispense Refill   ALPRAZolam (XANAX) 0.5 MG tablet Take 0.5 mg by mouth daily as needed.     amLODipine (NORVASC) 2.5 MG tablet Take 2.5 mg by mouth daily. Take 1 Tablet Daily.     amLODipine (NORVASC) 5 MG tablet Take 1.5 tablets (7.5 mg total) by mouth daily. 45 tablet 11   apixaban (ELIQUIS) 2.5 MG TABS tablet Take 1 tablet (2.5 mg total) by mouth 2 (two) times daily. 60 tablet 11   Azilsartan Medoxomil (EDARBI) 80 MG TABS Take 40 mg by mouth daily.     buPROPion (WELLBUTRIN XL) 150 MG 24 hr tablet Take 150 mg by mouth daily.  carvedilol (COREG) 6.25 MG tablet Take 1 tablet (6.25 mg total) by mouth 2 (two) times daily. 180 tablet 3   Cholecalciferol 25 MCG (1000 UT) tablet Take by mouth.     clonazePAM (KLONOPIN) 0.5 MG tablet Take 1-2 mg by mouth daily.     cyanocobalamin (,VITAMIN B-12,) 1000 MCG/ML injection Inject 1,000 mcg into the muscle every 30 (thirty) days.     potassium chloride (KLOR-CON) 10 MEQ tablet Take 1 tablet (10 mEq total) by mouth daily. 30 tablet 11   tamsulosin (FLOMAX) 0.4 MG CAPS capsule Take by mouth.     triamterene (DYRENIUM) 100 MG capsule Take by mouth.      zaleplon (SONATA) 5 MG capsule Take 5 mg by mouth at bedtime.     Zolpidem Tartrate (AMBIEN PO) Take 1 tablet by mouth as needed (for sleep).     No current facility-administered medications for this visit.     Family History    Family History  Problem Relation Age of Onset   Heart failure Father    Alzheimer's disease Mother    Diabetes Daughter        Obesity   He indicated that his mother is deceased. He indicated that his father is deceased. He indicated that his brother is alive. He indicated that his maternal grandmother is deceased. He indicated that his maternal grandfather is deceased. He indicated that his paternal grandmother is deceased. He indicated that his paternal grandfather is deceased. He indicated that only one of his three daughters is alive. He indicated that his son is alive.  Social History    Social History   Socioeconomic History   Marital status: Married    Spouse name: Not on file   Number of children: 3   Years of education: Not on file   Highest education level: Not on file  Occupational History   Occupation: Field seismologist  Tobacco Use   Smoking status: Never   Smokeless tobacco: Never  Substance and Sexual Activity   Alcohol use: Yes    Alcohol/week: 0.0 standard drinks of alcohol    Comment: Occasional   Drug use: No   Sexual activity: Not on file  Other Topics Concern   Not on file  Social History Narrative   Consultant   Social Determinants of Health   Financial Resource Strain: Not on file  Food Insecurity: Not on file  Transportation Needs: Not on file  Physical Activity: Not on file  Stress: Not on file  Social Connections: Not on file  Intimate Partner Violence: Not on file     Review of Systems    General:  No chills, fever, night sweats or weight changes.  Cardiovascular:  No chest pain, dyspnea on exertion, edema, orthopnea, palpitations, paroxysmal nocturnal dyspnea. Dermatological: No rash,  lesions/masses Respiratory: No cough, dyspnea Urologic: No hematuria, dysuria Abdominal:   No nausea, vomiting, diarrhea, bright red blood per rectum, melena, or hematemesis Neurologic:  No visual changes, wkns, changes in mental status. All other systems reviewed and are otherwise negative except as noted above.     Physical Exam    VS:  BP 118/76   Pulse 65   Ht '5\' 10"'$  (1.778 m)   Wt 181 lb (82.1 kg)   SpO2 95%   BMI 25.97 kg/m  , BMI Body mass index is 25.97 kg/m.     GEN: Well nourished, well developed, in no acute distress. HEENT: normal. Neck: Supple, no JVD, carotid bruits, or masses. Cardiac: RRR, soft  systolic murmur, no Hande rubs, or gallops. No clubbing, cyanosis, edema.  Radials/DP/PT 2+ and equal bilaterally.  Respiratory:  Respirations regular and unlabored, clear to auscultation bilaterally. GI: Soft, nontender, nondistended, BS + x 4. MS: no deformity or atrophy. Skin: warm and dry, no rash. Neuro:  Strength and sensation are intact. Psych: Normal affect.  Accessory Clinical Findings    ECG personally reviewed by me today-sinus rhythm with low voltage, heart rate of 63 bpm- No acute changes  Lab Results  Component Value Date   WBC 4.6 05/24/2015   HGB 14.6 05/24/2015   HCT 43.3 05/24/2015   MCV 93.1 05/24/2015   PLT 129 (L) 05/24/2015   Lab Results  Component Value Date   CREATININE 1.25 04/30/2021   BUN 18 04/30/2021   NA 141 04/30/2021   K 4.4 04/30/2021   CL 91 (L) 04/30/2021   CO2 23 04/30/2021   Lab Results  Component Value Date   ALT 14 (L) 05/24/2015   AST 18 05/24/2015   ALKPHOS 84 05/24/2015   BILITOT 0.7 05/24/2015   No results found for: "CHOL", "HDL", "LDLCALC", "LDLDIRECT", "TRIG", "CHOLHDL"  No results found for: "HGBA1C"  Review of Prior Studies: Calcium score 08/2020: IMPRESSION:  1.  Total calcium score of 157 is between the 0 and 25 percentile for males above the age of 36.  2.  Definite, at least moderate  atherosclerotic plaque. Risk of coronary artery disease: Mild coronary artery disease highly likely, significant narrowings possible.    Echocardiogram 2019:  INTERPRETATION ---------------------------------------------------------------    MILD LV DYSFUNCTION (See above) WITH MILD LVH    NORMAL RIGHT VENTRICULAR SYSTOLIC FUNCTION    VALVULAR REGURGITATION: TRIVIAL MR, MILD PR, TRIVIAL TR    NO VALVULAR STENOSIS    INTERATRIAL SHUNT SEEN BY COLOR AND SPECTRAL DOPPLER    S/P ABLATION; IRREGULAR CARDIAC RHYTHM THROUGHOUT EXAM    NO PRIOR STUDY FOR COMPARISON    3D acquisition and reconstructions were performed as part of this    examination to more accurately quantify the effects of reduced left    ventricular ejection fraction. (post-processing on an Independent    workstation).    Assessment & Plan   1.  Frequent PVCs: Resolved with increased dose of carvedilol to 6.25 mg twice daily.  Blood pressures well controlled with reduction of amlodipine to 2.5 mg daily from 5 mg daily.  No evidence of orthostatic blood pressure or symptoms.  Continue current regimen.  2.  Hypertension: Well-controlled.  He brings with him a list of his blood pressures beginning early spring to today's date.  They have run much better in the 182X and 937J systolic.  Improved from 140s to 150s earlier.  He does have mild fatigue but no dizziness.  He remains active.  He is planning on taking a vacation shortly.  No changes in his regimen.  Continue carvedilol 6.25 mg twice daily, amlodipine 2.5 mg daily, and Edarbi 40 mg daily.  3.  Coronary artery disease: CTA in 2022 revealed a score of 157 with moderate atherosclerotic plaque and mild CAD.  He remains active walking and is traveling.  He offers no cardiac complaints.  Continue secondary management.  4.  Paroxysmal atrial fibrillation: Currently in normal sinus rhythm, low voltage P wave.  Patient will continue apixaban 7.5 mg daily.  We will be following up with  PCP prior to being seen in 6 months for labs.  He is to call us for any changes in his rhythm or  symptoms.   Current medicines are reviewed at length with the patient today.  I have spent 20 min's  dedicated to the care of this patient on the date of this encounter to include pre-visit review of records, assessment, management and diagnostic testing,with shared decision making. Signed, Phill Myron. West Pugh, ANP, AACC   03/15/2022 9:05 AM    [place] [address] Office [phone] Fax [phone]  Notice: This dictation was prepared with Dragon dictation along with smaller phrase technology. Any transcriptional errors that result from this process are unintentional and may not be corrected upon review.

## 2022-03-15 ENCOUNTER — Ambulatory Visit (INDEPENDENT_AMBULATORY_CARE_PROVIDER_SITE_OTHER): Payer: Medicare Other | Admitting: Adult Health

## 2022-03-15 ENCOUNTER — Encounter: Payer: Self-pay | Admitting: Adult Health

## 2022-03-15 VITALS — BP 118/76 | HR 65 | Ht 70.0 in | Wt 181.0 lb

## 2022-03-15 DIAGNOSIS — I480 Paroxysmal atrial fibrillation: Secondary | ICD-10-CM

## 2022-03-15 DIAGNOSIS — I10 Essential (primary) hypertension: Secondary | ICD-10-CM

## 2022-03-15 DIAGNOSIS — I2510 Atherosclerotic heart disease of native coronary artery without angina pectoris: Secondary | ICD-10-CM | POA: Diagnosis not present

## 2022-03-15 DIAGNOSIS — I493 Ventricular premature depolarization: Secondary | ICD-10-CM

## 2022-03-15 NOTE — Patient Instructions (Signed)
Medication Instructions:  No Changes *If you need a refill on your cardiac medications before your next appointment, please call your pharmacy*   Lab Work: No Labs If you have labs (blood work) drawn today and your tests are completely normal, you will receive your results only by: [place] (if you have MyChart) OR A paper copy in the mail If you have any lab test that is abnormal or we need to change your treatment, we will call you to review the results.   Testing/Procedures: No Testing   Follow-Up: At [facility], you and your health needs are our priority.  As part of our continuing mission to provide you with exceptional heart care, we have created designated Provider Care Teams.  These Care Teams include your primary Cardiologist (physician) and Advanced Practice Providers (APPs -  Physician Assistants and Nurse Practitioners) who all work together to provide you with the care you need, when you need it.    Your next appointment:   6 month(s)  The format for your next appointment:   In Person  Provider:   Pixie Casino, MD

## 2022-03-18 DIAGNOSIS — E785 Hyperlipidemia, unspecified: Secondary | ICD-10-CM | POA: Diagnosis not present

## 2022-03-18 DIAGNOSIS — N183 Chronic kidney disease, stage 3 unspecified: Secondary | ICD-10-CM | POA: Diagnosis not present

## 2022-03-18 DIAGNOSIS — I10 Essential (primary) hypertension: Secondary | ICD-10-CM | POA: Diagnosis not present

## 2022-03-18 DIAGNOSIS — I5032 Chronic diastolic (congestive) heart failure: Secondary | ICD-10-CM | POA: Diagnosis not present

## 2022-04-04 DIAGNOSIS — M25531 Pain in right wrist: Secondary | ICD-10-CM | POA: Diagnosis not present

## 2022-04-11 DIAGNOSIS — L821 Other seborrheic keratosis: Secondary | ICD-10-CM | POA: Diagnosis not present

## 2022-04-11 DIAGNOSIS — Z85820 Personal history of malignant melanoma of skin: Secondary | ICD-10-CM | POA: Diagnosis not present

## 2022-04-11 DIAGNOSIS — L570 Actinic keratosis: Secondary | ICD-10-CM | POA: Diagnosis not present

## 2022-04-18 DIAGNOSIS — I480 Paroxysmal atrial fibrillation: Secondary | ICD-10-CM | POA: Diagnosis not present

## 2022-04-18 DIAGNOSIS — I10 Essential (primary) hypertension: Secondary | ICD-10-CM | POA: Diagnosis not present

## 2022-04-18 DIAGNOSIS — Z7901 Long term (current) use of anticoagulants: Secondary | ICD-10-CM | POA: Diagnosis not present

## 2022-04-23 DIAGNOSIS — Z23 Encounter for immunization: Secondary | ICD-10-CM | POA: Diagnosis not present

## 2022-04-23 DIAGNOSIS — M25531 Pain in right wrist: Secondary | ICD-10-CM | POA: Diagnosis not present

## 2022-04-24 ENCOUNTER — Other Ambulatory Visit: Payer: Self-pay | Admitting: Medical

## 2022-05-17 DIAGNOSIS — Z23 Encounter for immunization: Secondary | ICD-10-CM | POA: Diagnosis not present

## 2022-05-18 DIAGNOSIS — I10 Essential (primary) hypertension: Secondary | ICD-10-CM | POA: Diagnosis not present

## 2022-05-18 DIAGNOSIS — Z7901 Long term (current) use of anticoagulants: Secondary | ICD-10-CM | POA: Diagnosis not present

## 2022-05-18 DIAGNOSIS — I480 Paroxysmal atrial fibrillation: Secondary | ICD-10-CM | POA: Diagnosis not present

## 2022-06-11 DIAGNOSIS — N182 Chronic kidney disease, stage 2 (mild): Secondary | ICD-10-CM | POA: Diagnosis not present

## 2022-06-24 DIAGNOSIS — L821 Other seborrheic keratosis: Secondary | ICD-10-CM | POA: Diagnosis not present

## 2022-06-24 DIAGNOSIS — L570 Actinic keratosis: Secondary | ICD-10-CM | POA: Diagnosis not present

## 2022-06-24 DIAGNOSIS — L578 Other skin changes due to chronic exposure to nonionizing radiation: Secondary | ICD-10-CM | POA: Diagnosis not present

## 2022-06-24 DIAGNOSIS — D485 Neoplasm of uncertain behavior of skin: Secondary | ICD-10-CM | POA: Diagnosis not present

## 2022-06-24 DIAGNOSIS — D229 Melanocytic nevi, unspecified: Secondary | ICD-10-CM | POA: Diagnosis not present

## 2022-06-24 DIAGNOSIS — C44311 Basal cell carcinoma of skin of nose: Secondary | ICD-10-CM | POA: Diagnosis not present

## 2022-06-24 DIAGNOSIS — L814 Other melanin hyperpigmentation: Secondary | ICD-10-CM | POA: Diagnosis not present

## 2022-07-10 DIAGNOSIS — I10 Essential (primary) hypertension: Secondary | ICD-10-CM | POA: Diagnosis not present

## 2022-07-10 DIAGNOSIS — Z0289 Encounter for other administrative examinations: Secondary | ICD-10-CM | POA: Diagnosis not present

## 2022-07-18 DIAGNOSIS — I10 Essential (primary) hypertension: Secondary | ICD-10-CM | POA: Diagnosis not present

## 2022-07-18 DIAGNOSIS — I480 Paroxysmal atrial fibrillation: Secondary | ICD-10-CM | POA: Diagnosis not present

## 2022-07-18 DIAGNOSIS — Z7901 Long term (current) use of anticoagulants: Secondary | ICD-10-CM | POA: Diagnosis not present

## 2022-07-25 DIAGNOSIS — M25531 Pain in right wrist: Secondary | ICD-10-CM | POA: Diagnosis not present

## 2022-09-03 DIAGNOSIS — I10 Essential (primary) hypertension: Secondary | ICD-10-CM | POA: Diagnosis not present

## 2022-09-06 DIAGNOSIS — D49519 Neoplasm of unspecified behavior of unspecified kidney: Secondary | ICD-10-CM | POA: Diagnosis not present

## 2022-09-06 DIAGNOSIS — N401 Enlarged prostate with lower urinary tract symptoms: Secondary | ICD-10-CM | POA: Diagnosis not present

## 2022-09-06 DIAGNOSIS — Z23 Encounter for immunization: Secondary | ICD-10-CM | POA: Diagnosis not present

## 2022-09-06 DIAGNOSIS — Z0000 Encounter for general adult medical examination without abnormal findings: Secondary | ICD-10-CM | POA: Diagnosis not present

## 2022-09-06 DIAGNOSIS — F329 Major depressive disorder, single episode, unspecified: Secondary | ICD-10-CM | POA: Diagnosis not present

## 2022-09-06 DIAGNOSIS — D72819 Decreased white blood cell count, unspecified: Secondary | ICD-10-CM | POA: Diagnosis not present

## 2022-09-06 DIAGNOSIS — I480 Paroxysmal atrial fibrillation: Secondary | ICD-10-CM | POA: Diagnosis not present

## 2022-09-06 DIAGNOSIS — I10 Essential (primary) hypertension: Secondary | ICD-10-CM | POA: Diagnosis not present

## 2022-09-06 DIAGNOSIS — F410 Panic disorder [episodic paroxysmal anxiety] without agoraphobia: Secondary | ICD-10-CM | POA: Diagnosis not present

## 2022-09-06 DIAGNOSIS — D6869 Other thrombophilia: Secondary | ICD-10-CM | POA: Diagnosis not present

## 2022-09-06 DIAGNOSIS — N5201 Erectile dysfunction due to arterial insufficiency: Secondary | ICD-10-CM | POA: Diagnosis not present

## 2022-09-06 DIAGNOSIS — D696 Thrombocytopenia, unspecified: Secondary | ICD-10-CM | POA: Diagnosis not present

## 2022-09-24 ENCOUNTER — Telehealth: Payer: Self-pay | Admitting: Internal Medicine

## 2022-09-24 NOTE — Telephone Encounter (Signed)
Pt called to report: Irregular heart beat- states KARDIA is "all over the place" says possible afib States he is light-headed and short of breath Had afib- ablation  6 yrs ago Pt reports that it just started again  No chest pain, no back/arm/neck/jaw pain, no nausea or sweating. Pt reports that he has been coughing a lot lately. He is in the car and a family member is driving.  Told him that he needs to go to the Emergency Room to be evaluated due to his irregular rhythm and shortness of breath. He can schedule a follow up appointment after he has been evaluated in the ER.  Pt verbalized understanding of instructions.

## 2022-09-24 NOTE — Telephone Encounter (Signed)
Patient c/o Palpitations:  High priority if patient c/o lightheadedness, shortness of breath, or chest pain  How long have you had palpitations/irregular HR/ Afib? Are you having the symptoms now? The last day and a half, yes  Are you currently experiencing lightheadedness, SOB or CP? Yes lightheadedness and SOB when he gets up and moves around  Do you have a history of afib (atrial fibrillation) or irregular heart rhythm? Yes afib 6 years ago  Have you checked your BP or HR? (document readings if available): 142/83 HR 47-85  Are you experiencing any other symptoms? Oxygen is 53   Patient states he was told to contact the office urgently due to his irregular heart rhythm. He says he does get lightheaded and SOB.

## 2022-09-25 DIAGNOSIS — R0989 Other specified symptoms and signs involving the circulatory and respiratory systems: Secondary | ICD-10-CM | POA: Diagnosis not present

## 2022-09-25 DIAGNOSIS — R051 Acute cough: Secondary | ICD-10-CM | POA: Diagnosis not present

## 2022-10-07 DIAGNOSIS — I10 Essential (primary) hypertension: Secondary | ICD-10-CM | POA: Diagnosis not present

## 2022-10-30 DIAGNOSIS — G8929 Other chronic pain: Secondary | ICD-10-CM | POA: Diagnosis not present

## 2022-10-30 DIAGNOSIS — Z96661 Presence of right artificial ankle joint: Secondary | ICD-10-CM | POA: Diagnosis not present

## 2022-10-30 DIAGNOSIS — Z471 Aftercare following joint replacement surgery: Secondary | ICD-10-CM | POA: Diagnosis not present

## 2022-10-30 DIAGNOSIS — M25571 Pain in right ankle and joints of right foot: Secondary | ICD-10-CM | POA: Diagnosis not present

## 2022-12-10 DIAGNOSIS — D229 Melanocytic nevi, unspecified: Secondary | ICD-10-CM | POA: Diagnosis not present

## 2022-12-10 DIAGNOSIS — D1801 Hemangioma of skin and subcutaneous tissue: Secondary | ICD-10-CM | POA: Diagnosis not present

## 2022-12-10 DIAGNOSIS — L570 Actinic keratosis: Secondary | ICD-10-CM | POA: Diagnosis not present

## 2022-12-10 DIAGNOSIS — L814 Other melanin hyperpigmentation: Secondary | ICD-10-CM | POA: Diagnosis not present

## 2022-12-10 DIAGNOSIS — L578 Other skin changes due to chronic exposure to nonionizing radiation: Secondary | ICD-10-CM | POA: Diagnosis not present

## 2022-12-10 DIAGNOSIS — Z85820 Personal history of malignant melanoma of skin: Secondary | ICD-10-CM | POA: Diagnosis not present

## 2022-12-10 DIAGNOSIS — L821 Other seborrheic keratosis: Secondary | ICD-10-CM | POA: Diagnosis not present

## 2022-12-10 DIAGNOSIS — Z85828 Personal history of other malignant neoplasm of skin: Secondary | ICD-10-CM | POA: Diagnosis not present

## 2022-12-10 DIAGNOSIS — C44311 Basal cell carcinoma of skin of nose: Secondary | ICD-10-CM | POA: Diagnosis not present

## 2022-12-27 DIAGNOSIS — N400 Enlarged prostate without lower urinary tract symptoms: Secondary | ICD-10-CM | POA: Diagnosis not present

## 2022-12-27 DIAGNOSIS — N2889 Other specified disorders of kidney and ureter: Secondary | ICD-10-CM | POA: Diagnosis not present

## 2022-12-27 DIAGNOSIS — C642 Malignant neoplasm of left kidney, except renal pelvis: Secondary | ICD-10-CM | POA: Diagnosis not present

## 2022-12-27 DIAGNOSIS — Z87891 Personal history of nicotine dependence: Secondary | ICD-10-CM | POA: Diagnosis not present

## 2022-12-27 DIAGNOSIS — Z9079 Acquired absence of other genital organ(s): Secondary | ICD-10-CM | POA: Diagnosis not present

## 2023-01-27 DIAGNOSIS — L570 Actinic keratosis: Secondary | ICD-10-CM | POA: Diagnosis not present

## 2023-01-27 DIAGNOSIS — L905 Scar conditions and fibrosis of skin: Secondary | ICD-10-CM | POA: Diagnosis not present

## 2023-01-31 ENCOUNTER — Other Ambulatory Visit: Payer: Self-pay | Admitting: General Surgery

## 2023-01-31 DIAGNOSIS — K4090 Unilateral inguinal hernia, without obstruction or gangrene, not specified as recurrent: Secondary | ICD-10-CM | POA: Diagnosis not present

## 2023-02-06 ENCOUNTER — Ambulatory Visit
Admission: RE | Admit: 2023-02-06 | Discharge: 2023-02-06 | Disposition: A | Discharge status: Home / Self Care | Payer: Medicare Other | Source: Ambulatory Visit | Attending: General Surgery | Admitting: General Surgery

## 2023-02-06 DIAGNOSIS — R1909 Other intra-abdominal and pelvic swelling, mass and lump: Secondary | ICD-10-CM | POA: Diagnosis not present

## 2023-02-06 DIAGNOSIS — K4090 Unilateral inguinal hernia, without obstruction or gangrene, not specified as recurrent: Secondary | ICD-10-CM

## 2023-02-24 DIAGNOSIS — L570 Actinic keratosis: Secondary | ICD-10-CM | POA: Diagnosis not present

## 2023-02-24 DIAGNOSIS — L578 Other skin changes due to chronic exposure to nonionizing radiation: Secondary | ICD-10-CM | POA: Diagnosis not present

## 2023-02-24 DIAGNOSIS — C44319 Basal cell carcinoma of skin of other parts of face: Secondary | ICD-10-CM | POA: Diagnosis not present

## 2023-03-20 DIAGNOSIS — C44311 Basal cell carcinoma of skin of nose: Secondary | ICD-10-CM | POA: Diagnosis not present

## 2023-04-01 DIAGNOSIS — C44229 Squamous cell carcinoma of skin of left ear and external auricular canal: Secondary | ICD-10-CM | POA: Diagnosis not present

## 2023-04-01 DIAGNOSIS — D485 Neoplasm of uncertain behavior of skin: Secondary | ICD-10-CM | POA: Diagnosis not present

## 2023-04-24 DIAGNOSIS — Z23 Encounter for immunization: Secondary | ICD-10-CM | POA: Diagnosis not present

## 2023-05-08 DIAGNOSIS — D0422 Carcinoma in situ of skin of left ear and external auricular canal: Secondary | ICD-10-CM | POA: Diagnosis not present

## 2023-05-22 DIAGNOSIS — C44301 Unspecified malignant neoplasm of skin of nose: Secondary | ICD-10-CM | POA: Diagnosis not present

## 2023-05-22 DIAGNOSIS — D485 Neoplasm of uncertain behavior of skin: Secondary | ICD-10-CM | POA: Diagnosis not present

## 2023-05-29 DIAGNOSIS — C44321 Squamous cell carcinoma of skin of nose: Secondary | ICD-10-CM | POA: Diagnosis not present

## 2023-06-13 DIAGNOSIS — Z23 Encounter for immunization: Secondary | ICD-10-CM | POA: Diagnosis not present

## 2023-06-19 DIAGNOSIS — L578 Other skin changes due to chronic exposure to nonionizing radiation: Secondary | ICD-10-CM | POA: Diagnosis not present

## 2023-06-19 DIAGNOSIS — Z85828 Personal history of other malignant neoplasm of skin: Secondary | ICD-10-CM | POA: Diagnosis not present

## 2023-06-19 DIAGNOSIS — D229 Melanocytic nevi, unspecified: Secondary | ICD-10-CM | POA: Diagnosis not present

## 2023-06-19 DIAGNOSIS — Z85820 Personal history of malignant melanoma of skin: Secondary | ICD-10-CM | POA: Diagnosis not present

## 2023-06-19 DIAGNOSIS — L570 Actinic keratosis: Secondary | ICD-10-CM | POA: Diagnosis not present

## 2023-06-19 DIAGNOSIS — L814 Other melanin hyperpigmentation: Secondary | ICD-10-CM | POA: Diagnosis not present

## 2023-06-19 DIAGNOSIS — L821 Other seborrheic keratosis: Secondary | ICD-10-CM | POA: Diagnosis not present

## 2023-07-03 DIAGNOSIS — R2681 Unsteadiness on feet: Secondary | ICD-10-CM | POA: Diagnosis not present

## 2023-07-04 DIAGNOSIS — R2681 Unsteadiness on feet: Secondary | ICD-10-CM | POA: Diagnosis not present

## 2023-07-07 DIAGNOSIS — R2681 Unsteadiness on feet: Secondary | ICD-10-CM | POA: Diagnosis not present

## 2023-07-10 DIAGNOSIS — T8131XA Disruption of external operation (surgical) wound, not elsewhere classified, initial encounter: Secondary | ICD-10-CM | POA: Diagnosis not present

## 2023-07-10 DIAGNOSIS — D485 Neoplasm of uncertain behavior of skin: Secondary | ICD-10-CM | POA: Diagnosis not present

## 2023-07-30 DIAGNOSIS — N182 Chronic kidney disease, stage 2 (mild): Secondary | ICD-10-CM | POA: Diagnosis not present

## 2023-07-30 DIAGNOSIS — R809 Proteinuria, unspecified: Secondary | ICD-10-CM | POA: Diagnosis not present

## 2023-07-30 DIAGNOSIS — Z7901 Long term (current) use of anticoagulants: Secondary | ICD-10-CM | POA: Diagnosis not present

## 2023-07-30 DIAGNOSIS — Z23 Encounter for immunization: Secondary | ICD-10-CM | POA: Diagnosis not present

## 2023-07-30 DIAGNOSIS — Z79899 Other long term (current) drug therapy: Secondary | ICD-10-CM | POA: Diagnosis not present

## 2023-07-30 DIAGNOSIS — Z87891 Personal history of nicotine dependence: Secondary | ICD-10-CM | POA: Diagnosis not present

## 2023-07-30 DIAGNOSIS — D631 Anemia in chronic kidney disease: Secondary | ICD-10-CM | POA: Diagnosis not present

## 2023-07-30 DIAGNOSIS — I129 Hypertensive chronic kidney disease with stage 1 through stage 4 chronic kidney disease, or unspecified chronic kidney disease: Secondary | ICD-10-CM | POA: Diagnosis not present

## 2023-07-30 DIAGNOSIS — E213 Hyperparathyroidism, unspecified: Secondary | ICD-10-CM | POA: Diagnosis not present

## 2023-08-19 DIAGNOSIS — N138 Other obstructive and reflux uropathy: Secondary | ICD-10-CM | POA: Diagnosis not present

## 2023-08-19 DIAGNOSIS — N401 Enlarged prostate with lower urinary tract symptoms: Secondary | ICD-10-CM | POA: Diagnosis not present

## 2023-08-25 DIAGNOSIS — I10 Essential (primary) hypertension: Secondary | ICD-10-CM | POA: Diagnosis not present

## 2023-08-25 DIAGNOSIS — Z8679 Personal history of other diseases of the circulatory system: Secondary | ICD-10-CM | POA: Diagnosis not present

## 2023-08-25 DIAGNOSIS — I480 Paroxysmal atrial fibrillation: Secondary | ICD-10-CM | POA: Diagnosis not present

## 2023-08-25 DIAGNOSIS — Z9889 Other specified postprocedural states: Secondary | ICD-10-CM | POA: Diagnosis not present

## 2023-09-08 DIAGNOSIS — D696 Thrombocytopenia, unspecified: Secondary | ICD-10-CM | POA: Diagnosis not present

## 2023-09-08 DIAGNOSIS — I10 Essential (primary) hypertension: Secondary | ICD-10-CM | POA: Diagnosis not present

## 2023-09-11 DIAGNOSIS — I4892 Unspecified atrial flutter: Secondary | ICD-10-CM | POA: Diagnosis not present

## 2023-09-11 DIAGNOSIS — I4719 Other supraventricular tachycardia: Secondary | ICD-10-CM | POA: Diagnosis not present

## 2023-09-11 DIAGNOSIS — R739 Hyperglycemia, unspecified: Secondary | ICD-10-CM | POA: Diagnosis not present

## 2023-09-11 DIAGNOSIS — R001 Bradycardia, unspecified: Secondary | ICD-10-CM | POA: Diagnosis not present

## 2023-09-11 DIAGNOSIS — I4891 Unspecified atrial fibrillation: Secondary | ICD-10-CM | POA: Diagnosis not present

## 2023-09-11 DIAGNOSIS — I2510 Atherosclerotic heart disease of native coronary artery without angina pectoris: Secondary | ICD-10-CM | POA: Diagnosis not present

## 2023-09-11 DIAGNOSIS — D696 Thrombocytopenia, unspecified: Secondary | ICD-10-CM | POA: Diagnosis not present

## 2023-09-11 DIAGNOSIS — D510 Vitamin B12 deficiency anemia due to intrinsic factor deficiency: Secondary | ICD-10-CM | POA: Diagnosis not present

## 2023-09-11 DIAGNOSIS — I951 Orthostatic hypotension: Secondary | ICD-10-CM | POA: Diagnosis not present

## 2023-09-11 DIAGNOSIS — F5104 Psychophysiologic insomnia: Secondary | ICD-10-CM | POA: Diagnosis not present

## 2023-09-11 DIAGNOSIS — I480 Paroxysmal atrial fibrillation: Secondary | ICD-10-CM | POA: Diagnosis not present

## 2023-09-11 DIAGNOSIS — Z0000 Encounter for general adult medical examination without abnormal findings: Secondary | ICD-10-CM | POA: Diagnosis not present

## 2023-09-11 DIAGNOSIS — D6869 Other thrombophilia: Secondary | ICD-10-CM | POA: Diagnosis not present

## 2023-09-11 DIAGNOSIS — Z8679 Personal history of other diseases of the circulatory system: Secondary | ICD-10-CM | POA: Diagnosis not present

## 2023-09-11 DIAGNOSIS — F410 Panic disorder [episodic paroxysmal anxiety] without agoraphobia: Secondary | ICD-10-CM | POA: Diagnosis not present

## 2023-09-11 DIAGNOSIS — Z9889 Other specified postprocedural states: Secondary | ICD-10-CM | POA: Diagnosis not present

## 2023-09-11 DIAGNOSIS — I10 Essential (primary) hypertension: Secondary | ICD-10-CM | POA: Diagnosis not present

## 2023-09-12 DIAGNOSIS — R739 Hyperglycemia, unspecified: Secondary | ICD-10-CM | POA: Diagnosis not present

## 2023-10-12 DIAGNOSIS — I4892 Unspecified atrial flutter: Secondary | ICD-10-CM | POA: Diagnosis not present

## 2023-10-29 DIAGNOSIS — I480 Paroxysmal atrial fibrillation: Secondary | ICD-10-CM | POA: Diagnosis not present

## 2023-10-29 DIAGNOSIS — I4892 Unspecified atrial flutter: Secondary | ICD-10-CM | POA: Diagnosis not present

## 2023-10-29 DIAGNOSIS — I10 Essential (primary) hypertension: Secondary | ICD-10-CM | POA: Diagnosis not present

## 2023-10-29 DIAGNOSIS — I4719 Other supraventricular tachycardia: Secondary | ICD-10-CM | POA: Diagnosis not present

## 2023-11-03 ENCOUNTER — Other Ambulatory Visit: Payer: Self-pay | Admitting: Adult Health

## 2023-11-11 DIAGNOSIS — I4892 Unspecified atrial flutter: Secondary | ICD-10-CM | POA: Diagnosis not present

## 2023-11-26 DIAGNOSIS — M19071 Primary osteoarthritis, right ankle and foot: Secondary | ICD-10-CM | POA: Diagnosis not present

## 2023-11-26 DIAGNOSIS — Z96661 Presence of right artificial ankle joint: Secondary | ICD-10-CM | POA: Diagnosis not present

## 2023-12-22 DIAGNOSIS — L814 Other melanin hyperpigmentation: Secondary | ICD-10-CM | POA: Diagnosis not present

## 2023-12-22 DIAGNOSIS — L578 Other skin changes due to chronic exposure to nonionizing radiation: Secondary | ICD-10-CM | POA: Diagnosis not present

## 2023-12-22 DIAGNOSIS — D1801 Hemangioma of skin and subcutaneous tissue: Secondary | ICD-10-CM | POA: Diagnosis not present

## 2023-12-22 DIAGNOSIS — L821 Other seborrheic keratosis: Secondary | ICD-10-CM | POA: Diagnosis not present

## 2023-12-22 DIAGNOSIS — Z85820 Personal history of malignant melanoma of skin: Secondary | ICD-10-CM | POA: Diagnosis not present

## 2023-12-22 DIAGNOSIS — D229 Melanocytic nevi, unspecified: Secondary | ICD-10-CM | POA: Diagnosis not present

## 2023-12-22 DIAGNOSIS — Z85828 Personal history of other malignant neoplasm of skin: Secondary | ICD-10-CM | POA: Diagnosis not present

## 2023-12-22 DIAGNOSIS — L570 Actinic keratosis: Secondary | ICD-10-CM | POA: Diagnosis not present

## 2023-12-26 DIAGNOSIS — N2889 Other specified disorders of kidney and ureter: Secondary | ICD-10-CM | POA: Diagnosis not present

## 2023-12-26 DIAGNOSIS — N401 Enlarged prostate with lower urinary tract symptoms: Secondary | ICD-10-CM | POA: Diagnosis not present

## 2023-12-26 DIAGNOSIS — R3911 Hesitancy of micturition: Secondary | ICD-10-CM | POA: Diagnosis not present

## 2023-12-26 DIAGNOSIS — Z87891 Personal history of nicotine dependence: Secondary | ICD-10-CM | POA: Diagnosis not present

## 2024-01-23 ENCOUNTER — Other Ambulatory Visit: Payer: Self-pay

## 2024-01-23 MED ORDER — CARVEDILOL 6.25 MG PO TABS: 6.2500 mg | ORAL_TABLET | Freq: Two times a day (BID) | ORAL | 0 refills | Status: DC

## 2024-03-10 DIAGNOSIS — I4719 Other supraventricular tachycardia: Secondary | ICD-10-CM | POA: Diagnosis not present

## 2024-03-10 DIAGNOSIS — I10 Essential (primary) hypertension: Secondary | ICD-10-CM | POA: Diagnosis not present

## 2024-03-10 DIAGNOSIS — I440 Atrioventricular block, first degree: Secondary | ICD-10-CM | POA: Diagnosis not present

## 2024-03-10 DIAGNOSIS — Z9889 Other specified postprocedural states: Secondary | ICD-10-CM | POA: Diagnosis not present

## 2024-03-10 DIAGNOSIS — Z8679 Personal history of other diseases of the circulatory system: Secondary | ICD-10-CM | POA: Diagnosis not present

## 2024-03-10 DIAGNOSIS — I493 Ventricular premature depolarization: Secondary | ICD-10-CM | POA: Diagnosis not present

## 2024-03-10 DIAGNOSIS — I480 Paroxysmal atrial fibrillation: Secondary | ICD-10-CM | POA: Diagnosis not present

## 2024-03-13 ENCOUNTER — Other Ambulatory Visit: Payer: Self-pay | Admitting: Internal Medicine

## 2024-03-17 DIAGNOSIS — I4891 Unspecified atrial fibrillation: Secondary | ICD-10-CM | POA: Diagnosis not present

## 2024-03-17 DIAGNOSIS — R7303 Prediabetes: Secondary | ICD-10-CM | POA: Diagnosis not present

## 2024-03-17 DIAGNOSIS — I10 Essential (primary) hypertension: Secondary | ICD-10-CM | POA: Diagnosis not present

## 2024-03-17 DIAGNOSIS — F410 Panic disorder [episodic paroxysmal anxiety] without agoraphobia: Secondary | ICD-10-CM | POA: Diagnosis not present

## 2024-03-17 DIAGNOSIS — F339 Major depressive disorder, recurrent, unspecified: Secondary | ICD-10-CM | POA: Diagnosis not present

## 2024-03-17 DIAGNOSIS — D696 Thrombocytopenia, unspecified: Secondary | ICD-10-CM | POA: Diagnosis not present

## 2024-03-17 DIAGNOSIS — N401 Enlarged prostate with lower urinary tract symptoms: Secondary | ICD-10-CM | POA: Diagnosis not present

## 2024-03-27 ENCOUNTER — Other Ambulatory Visit: Payer: Self-pay | Admitting: Internal Medicine

## 2024-04-08 DIAGNOSIS — L821 Other seborrheic keratosis: Secondary | ICD-10-CM | POA: Diagnosis not present

## 2024-04-08 DIAGNOSIS — T148XXA Other injury of unspecified body region, initial encounter: Secondary | ICD-10-CM | POA: Diagnosis not present

## 2024-04-15 DIAGNOSIS — M7542 Impingement syndrome of left shoulder: Secondary | ICD-10-CM | POA: Diagnosis not present

## 2024-04-15 DIAGNOSIS — M9902 Segmental and somatic dysfunction of thoracic region: Secondary | ICD-10-CM | POA: Diagnosis not present

## 2024-04-15 DIAGNOSIS — M9907 Segmental and somatic dysfunction of upper extremity: Secondary | ICD-10-CM | POA: Diagnosis not present

## 2024-04-15 DIAGNOSIS — M9901 Segmental and somatic dysfunction of cervical region: Secondary | ICD-10-CM | POA: Diagnosis not present

## 2024-04-16 DIAGNOSIS — I10 Essential (primary) hypertension: Secondary | ICD-10-CM | POA: Diagnosis not present

## 2024-04-20 DIAGNOSIS — N401 Enlarged prostate with lower urinary tract symptoms: Secondary | ICD-10-CM | POA: Diagnosis not present

## 2024-04-20 DIAGNOSIS — Z87891 Personal history of nicotine dependence: Secondary | ICD-10-CM | POA: Diagnosis not present

## 2024-04-20 DIAGNOSIS — R3911 Hesitancy of micturition: Secondary | ICD-10-CM | POA: Diagnosis not present

## 2024-04-21 DIAGNOSIS — M9902 Segmental and somatic dysfunction of thoracic region: Secondary | ICD-10-CM | POA: Diagnosis not present

## 2024-04-21 DIAGNOSIS — M7542 Impingement syndrome of left shoulder: Secondary | ICD-10-CM | POA: Diagnosis not present

## 2024-04-21 DIAGNOSIS — M9907 Segmental and somatic dysfunction of upper extremity: Secondary | ICD-10-CM | POA: Diagnosis not present

## 2024-04-21 DIAGNOSIS — M9901 Segmental and somatic dysfunction of cervical region: Secondary | ICD-10-CM | POA: Diagnosis not present

## 2024-04-26 DIAGNOSIS — M9907 Segmental and somatic dysfunction of upper extremity: Secondary | ICD-10-CM | POA: Diagnosis not present

## 2024-04-26 DIAGNOSIS — M9901 Segmental and somatic dysfunction of cervical region: Secondary | ICD-10-CM | POA: Diagnosis not present

## 2024-04-26 DIAGNOSIS — M7542 Impingement syndrome of left shoulder: Secondary | ICD-10-CM | POA: Diagnosis not present

## 2024-04-26 DIAGNOSIS — M9902 Segmental and somatic dysfunction of thoracic region: Secondary | ICD-10-CM | POA: Diagnosis not present

## 2024-05-05 DIAGNOSIS — M9907 Segmental and somatic dysfunction of upper extremity: Secondary | ICD-10-CM | POA: Diagnosis not present

## 2024-05-05 DIAGNOSIS — I10 Essential (primary) hypertension: Secondary | ICD-10-CM | POA: Diagnosis not present

## 2024-05-05 DIAGNOSIS — M7542 Impingement syndrome of left shoulder: Secondary | ICD-10-CM | POA: Diagnosis not present

## 2024-05-05 DIAGNOSIS — M9902 Segmental and somatic dysfunction of thoracic region: Secondary | ICD-10-CM | POA: Diagnosis not present

## 2024-05-05 DIAGNOSIS — M9901 Segmental and somatic dysfunction of cervical region: Secondary | ICD-10-CM | POA: Diagnosis not present

## 2024-05-10 DIAGNOSIS — Z23 Encounter for immunization: Secondary | ICD-10-CM | POA: Diagnosis not present

## 2024-05-25 DIAGNOSIS — I10 Essential (primary) hypertension: Secondary | ICD-10-CM | POA: Diagnosis not present

## 2024-06-09 DIAGNOSIS — I10 Essential (primary) hypertension: Secondary | ICD-10-CM | POA: Diagnosis not present

## 2024-06-16 DIAGNOSIS — L814 Other melanin hyperpigmentation: Secondary | ICD-10-CM | POA: Diagnosis not present

## 2024-06-16 DIAGNOSIS — D1801 Hemangioma of skin and subcutaneous tissue: Secondary | ICD-10-CM | POA: Diagnosis not present

## 2024-06-16 DIAGNOSIS — L578 Other skin changes due to chronic exposure to nonionizing radiation: Secondary | ICD-10-CM | POA: Diagnosis not present

## 2024-06-16 DIAGNOSIS — L570 Actinic keratosis: Secondary | ICD-10-CM | POA: Diagnosis not present

## 2024-06-16 DIAGNOSIS — Z85828 Personal history of other malignant neoplasm of skin: Secondary | ICD-10-CM | POA: Diagnosis not present

## 2024-06-16 DIAGNOSIS — Z85820 Personal history of malignant melanoma of skin: Secondary | ICD-10-CM | POA: Diagnosis not present

## 2024-06-16 DIAGNOSIS — L821 Other seborrheic keratosis: Secondary | ICD-10-CM | POA: Diagnosis not present

## 2024-06-16 DIAGNOSIS — D229 Melanocytic nevi, unspecified: Secondary | ICD-10-CM | POA: Diagnosis not present

## 2024-07-21 DIAGNOSIS — I480 Paroxysmal atrial fibrillation: Secondary | ICD-10-CM | POA: Diagnosis not present

## 2024-07-21 DIAGNOSIS — I4892 Unspecified atrial flutter: Secondary | ICD-10-CM | POA: Diagnosis not present

## 2024-07-21 DIAGNOSIS — I4719 Other supraventricular tachycardia: Secondary | ICD-10-CM | POA: Diagnosis not present

## 2024-07-27 DIAGNOSIS — R338 Other retention of urine: Secondary | ICD-10-CM | POA: Diagnosis not present

## 2024-07-27 DIAGNOSIS — N401 Enlarged prostate with lower urinary tract symptoms: Secondary | ICD-10-CM | POA: Diagnosis not present

## 2024-07-27 DIAGNOSIS — Z87891 Personal history of nicotine dependence: Secondary | ICD-10-CM | POA: Diagnosis not present

## 2024-08-03 DIAGNOSIS — I4719 Other supraventricular tachycardia: Secondary | ICD-10-CM | POA: Diagnosis not present

## 2024-08-04 DIAGNOSIS — I4719 Other supraventricular tachycardia: Secondary | ICD-10-CM | POA: Diagnosis not present

## 2024-08-04 DIAGNOSIS — I480 Paroxysmal atrial fibrillation: Secondary | ICD-10-CM | POA: Diagnosis not present

## 2024-08-04 DIAGNOSIS — I4892 Unspecified atrial flutter: Secondary | ICD-10-CM | POA: Diagnosis not present

## 2024-08-04 DIAGNOSIS — I493 Ventricular premature depolarization: Secondary | ICD-10-CM | POA: Diagnosis not present
